# Patient Record
Sex: Male | Born: 1935 | Hispanic: No | Smoking: Current every day smoker
Health system: Southern US, Community
[De-identification: ages and names within clinical notes are randomized; demographics above are authoritative.]

## PROBLEM LIST (undated history)

## (undated) DIAGNOSIS — F0390 Unspecified dementia without behavioral disturbance: Secondary | ICD-10-CM

## (undated) DIAGNOSIS — Z7289 Other problems related to lifestyle: Secondary | ICD-10-CM

## (undated) DIAGNOSIS — F17200 Nicotine dependence, unspecified, uncomplicated: Secondary | ICD-10-CM

## (undated) DIAGNOSIS — H9190 Unspecified hearing loss, unspecified ear: Secondary | ICD-10-CM

## (undated) DIAGNOSIS — Z8619 Personal history of other infectious and parasitic diseases: Secondary | ICD-10-CM

## (undated) DIAGNOSIS — E538 Deficiency of other specified B group vitamins: Secondary | ICD-10-CM

## (undated) DIAGNOSIS — I739 Peripheral vascular disease, unspecified: Secondary | ICD-10-CM

## (undated) DIAGNOSIS — I10 Essential (primary) hypertension: Secondary | ICD-10-CM

## (undated) DIAGNOSIS — F1090 Alcohol use, unspecified, uncomplicated: Secondary | ICD-10-CM

## (undated) DIAGNOSIS — G459 Transient cerebral ischemic attack, unspecified: Secondary | ICD-10-CM

## (undated) DIAGNOSIS — E785 Hyperlipidemia, unspecified: Secondary | ICD-10-CM

## (undated) DIAGNOSIS — J449 Chronic obstructive pulmonary disease, unspecified: Secondary | ICD-10-CM

## (undated) HISTORY — DX: Hyperlipidemia, unspecified: E78.5

## (undated) HISTORY — DX: Essential (primary) hypertension: I10

## (undated) HISTORY — DX: Deficiency of other specified B group vitamins: E53.8

## (undated) HISTORY — DX: Chronic obstructive pulmonary disease, unspecified: J44.9

## (undated) HISTORY — DX: Unspecified dementia, unspecified severity, without behavioral disturbance, psychotic disturbance, mood disturbance, and anxiety: F03.90

## (undated) HISTORY — DX: Personal history of other infectious and parasitic diseases: Z86.19

## (undated) HISTORY — PX: TONSILLECTOMY: SUR1361

## (undated) HISTORY — PX: CATARACT EXTRACTION, BILATERAL: SHX1313

## (undated) HISTORY — DX: Transient cerebral ischemic attack, unspecified: G45.9

## (undated) HISTORY — DX: Unspecified hearing loss, unspecified ear: H91.90

## (undated) HISTORY — DX: Nicotine dependence, unspecified, uncomplicated: F17.200

## (undated) HISTORY — DX: Alcohol use, unspecified, uncomplicated: F10.90

## (undated) HISTORY — DX: Peripheral vascular disease, unspecified: I73.9

## (undated) HISTORY — DX: Other problems related to lifestyle: Z72.89

---

## 2009-02-11 LAB — PULMONARY FUNCTION TEST

## 2009-06-14 DIAGNOSIS — G459 Transient cerebral ischemic attack, unspecified: Secondary | ICD-10-CM

## 2009-06-14 HISTORY — DX: Transient cerebral ischemic attack, unspecified: G45.9

## 2009-06-20 ENCOUNTER — Inpatient Hospital Stay (HOSPITAL_COMMUNITY): Admission: EM | Admit: 2009-06-20 | Discharge: 2009-06-22 | Payer: Self-pay | Admitting: Emergency Medicine

## 2009-06-21 ENCOUNTER — Encounter (INDEPENDENT_AMBULATORY_CARE_PROVIDER_SITE_OTHER): Payer: Self-pay | Admitting: Neurology

## 2010-11-16 LAB — URINALYSIS, ROUTINE W REFLEX MICROSCOPIC
Bilirubin Urine: NEGATIVE
Ketones, ur: NEGATIVE mg/dL
Specific Gravity, Urine: 1.016 (ref 1.005–1.030)
Urobilinogen, UA: 0.2 mg/dL (ref 0.0–1.0)

## 2010-11-16 LAB — CBC
Hemoglobin: 12.6 g/dL — ABNORMAL LOW (ref 13.0–17.0)
MCHC: 34.8 g/dL (ref 30.0–36.0)
RBC: 3.72 MIL/uL — ABNORMAL LOW (ref 4.22–5.81)
RDW: 12.4 % (ref 11.5–15.5)

## 2010-11-16 LAB — CK TOTAL AND CKMB (NOT AT ARMC)
CK, MB: 2.3 ng/mL (ref 0.3–4.0)
Relative Index: INVALID (ref 0.0–2.5)
Total CK: 80 U/L (ref 7–232)

## 2010-11-16 LAB — DIFFERENTIAL
Basophils Relative: 1 % (ref 0–1)
Lymphocytes Relative: 19 % (ref 12–46)
Lymphs Abs: 1.2 10*3/uL (ref 0.7–4.0)
Monocytes Relative: 11 % (ref 3–12)
Neutro Abs: 4.2 10*3/uL (ref 1.7–7.7)
Neutrophils Relative %: 68 % (ref 43–77)

## 2010-11-16 LAB — GLUCOSE, CAPILLARY
Glucose-Capillary: 106 mg/dL — ABNORMAL HIGH (ref 70–99)
Glucose-Capillary: 95 mg/dL (ref 70–99)

## 2010-11-16 LAB — COMPREHENSIVE METABOLIC PANEL
ALT: 13 U/L (ref 0–53)
AST: 16 U/L (ref 0–37)
Albumin: 3.5 g/dL (ref 3.5–5.2)
BUN: 13 mg/dL (ref 6–23)
CO2: 22 mEq/L (ref 19–32)
Calcium: 8.6 mg/dL (ref 8.4–10.5)
Chloride: 99 mEq/L (ref 96–112)
Creatinine, Ser: 1.19 mg/dL (ref 0.4–1.5)
GFR calc Af Amer: >60 mL/min (ref >60)
GFR calc non Af Amer: >60 mL/min (ref >60)
Glucose, Bld: 111 mg/dL — ABNORMAL HIGH (ref 70–99)
Sodium: 137 mEq/L (ref 135–145)
Total Bilirubin: 0.7 mg/dL (ref 0.3–1.2)
Total Protein: 7.1 g/dL (ref 6.0–8.3)

## 2010-11-16 LAB — APTT
aPTT: 33 seconds (ref 24–37)
aPTT: 33 seconds (ref 24–37)

## 2010-11-16 LAB — PROTIME-INR
INR: 0.98 (ref 0.00–1.49)
INR: 1 (ref 0.00–1.49)

## 2010-11-16 LAB — URINE CULTURE: Colony Count: NO GROWTH

## 2010-11-16 LAB — LIPID PANEL
LDL Cholesterol: 107 mg/dL — ABNORMAL HIGH (ref 0–99)
Total CHOL/HDL Ratio: 3 RATIO
Triglycerides: 100 mg/dL (ref <150)
VLDL: 11 mg/dL (ref 0–40)

## 2010-11-16 LAB — CARDIAC PANEL(CRET KIN+CKTOT+MB+TROPI): Troponin I: 0.02 ng/mL (ref 0.00–0.06)

## 2010-11-16 LAB — TROPONIN I: Troponin I: 0.03 ng/mL (ref 0.00–0.06)

## 2010-11-16 LAB — URINE MICROSCOPIC-ADD ON

## 2012-07-25 LAB — COMPREHENSIVE METABOLIC PANEL: Creat: 1.55

## 2012-07-25 LAB — TSH: TSH: 2.27

## 2012-07-25 LAB — LIPID PANEL
Direct LDL: 93
Triglycerides: 102

## 2012-08-03 ENCOUNTER — Emergency Department (HOSPITAL_COMMUNITY)
Admission: EM | Admit: 2012-08-03 | Discharge: 2012-08-04 | Disposition: A | Discharge status: Home / Self Care | Payer: Medicare Other | Attending: Emergency Medicine | Admitting: Emergency Medicine

## 2012-08-03 ENCOUNTER — Encounter (HOSPITAL_COMMUNITY): Payer: Self-pay | Admitting: *Deleted

## 2012-08-03 DIAGNOSIS — Y849 Medical procedure, unspecified as the cause of abnormal reaction of the patient, or of later complication, without mention of misadventure at the time of the procedure: Secondary | ICD-10-CM | POA: Insufficient documentation

## 2012-08-03 DIAGNOSIS — E119 Type 2 diabetes mellitus without complications: Secondary | ICD-10-CM | POA: Insufficient documentation

## 2012-08-03 DIAGNOSIS — F17200 Nicotine dependence, unspecified, uncomplicated: Secondary | ICD-10-CM | POA: Insufficient documentation

## 2012-08-03 DIAGNOSIS — Z7982 Long term (current) use of aspirin: Secondary | ICD-10-CM | POA: Insufficient documentation

## 2012-08-03 DIAGNOSIS — IMO0002 Reserved for concepts with insufficient information to code with codable children: Secondary | ICD-10-CM | POA: Insufficient documentation

## 2012-08-03 DIAGNOSIS — Z79899 Other long term (current) drug therapy: Secondary | ICD-10-CM | POA: Insufficient documentation

## 2012-08-03 LAB — COMPREHENSIVE METABOLIC PANEL
AST: 14 U/L (ref 0–37)
Alkaline Phosphatase: 53 U/L (ref 39–117)
BUN: 22 mg/dL (ref 6–23)
CO2: 24 mEq/L (ref 19–32)
Chloride: 104 mEq/L (ref 96–112)
Creatinine, Ser: 1.27 mg/dL (ref 0.50–1.35)
GFR calc non Af Amer: 54 mL/min — ABNORMAL LOW (ref >90)
Total Bilirubin: 0.2 mg/dL — ABNORMAL LOW (ref 0.3–1.2)

## 2012-08-03 LAB — CBC WITH DIFFERENTIAL/PLATELET
Basophils Absolute: 0 10*3/uL (ref 0.0–0.1)
Basophils Relative: 0 % (ref 0–1)
HCT: 33.4 % — ABNORMAL LOW (ref 39.0–52.0)
Hemoglobin: 11.4 g/dL — ABNORMAL LOW (ref 13.0–17.0)
Lymphocytes Relative: 28 % (ref 12–46)
MCH: 32 pg (ref 26.0–34.0)
Monocytes Absolute: 0.9 10*3/uL (ref 0.1–1.0)
Monocytes Relative: 12 % (ref 3–12)
Neutro Abs: 4.1 10*3/uL (ref 1.7–7.7)
RDW: 12.2 % (ref 11.5–15.5)
WBC: 7.3 10*3/uL (ref 4.0–10.5)

## 2012-08-03 NOTE — ED Notes (Signed)
The pt had dental work on dec 6th and since then he has been bleeding from his mouth almost continuous

## 2012-08-03 NOTE — ED Notes (Signed)
Tea bag placed in the area of his mouth that is bleeding

## 2012-08-04 ENCOUNTER — Emergency Department (HOSPITAL_COMMUNITY): Payer: Medicare Other

## 2012-08-04 LAB — HEMOGLOBIN AND HEMATOCRIT, BLOOD: HCT: 33.9 % — ABNORMAL LOW (ref 39.0–52.0)

## 2012-08-04 MED ORDER — AMINOCAPROIC ACID SOLUTION 5% (50 MG/ML)
5.0000 mL | ORAL | Status: AC
  Administered 2012-08-04: 5 mL via ORAL
  Filled 2012-08-04: qty 100

## 2012-08-04 MED ORDER — AMINOCAPROIC ACID SOLUTION 5% (50 MG/ML): 5.0000 mL | ORAL | Status: DC

## 2012-08-04 NOTE — ED Notes (Signed)
Patient transported to X-ray 

## 2012-08-04 NOTE — ED Notes (Signed)
MD at bedside. 

## 2012-08-04 NOTE — ED Provider Notes (Addendum)
History     CSN: [id number]  Arrival date & time 08/03/12  2046   First MD Initiated Contact with Patient 08/03/12 2322      Chief Complaint  Patient presents with  . Dental Problem    (Consider location/radiation/quality/duration/timing/severity/associated sxs/prior treatment) HPI Comments: Shaun Contreras awoke this morning with oozing blood from a dental extraction performed on 12/6.  Shaun Contreras denies any fever, jaw pain, localized pain, trauma, or bleeding from other sites.  Shaun Contreras reports feeling otherwise well.  Shaun Contreras contacted his dentist but was unable to get a follow-up visit.  The oozing stopped briefly but has returned.  Shaun Contreras has attempted to apply direct pressure and biting on a tea bag without success.  The history is provided by the patient. No language interpreter was used.    Past Medical History  Diagnosis Date  . Diabetes mellitus without complication     History reviewed. No pertinent past surgical history.  No family history on file.  History  Substance Use Topics  . Smoking status: Current Every Day Smoker  . Smokeless tobacco: Not on file  . Alcohol Use: Yes      Review of Systems  All other systems reviewed and are negative.    Allergies  Review of patient's allergies indicates no known allergies.  Home Medications   Current Outpatient Rx  Name  Route  Sig  Dispense  Refill  . ASPIRIN EC 325 MG PO TBEC   Oral   Take 325 mg by mouth daily.         Marland Kitchen VITAMIN D3 2000 UNITS PO TABS   Oral   Take 1 tablet by mouth daily.         . DONEPEZIL HCL 10 MG PO TABS   Oral   Take 10 mg by mouth 2 (two) times daily.         Marland Kitchen LISINOPRIL 10 MG PO TABS   Oral   Take 10 mg by mouth daily.         Marland Kitchen PRAVASTATIN SODIUM 40 MG PO TABS   Oral   Take 40 mg by mouth daily.           BP 124/50  Pulse 62  Temp 98.3 F (36.8 C) (Oral)  Resp 20  SpO2 98%  Physical Exam  Nursing note and vitals reviewed. Constitutional: Shaun Contreras is oriented to person, place,  and time. Shaun Contreras appears well-developed and well-nourished. Shaun Contreras is active and cooperative.  Non-toxic appearance. Shaun Contreras does not have a sickly appearance. Shaun Contreras does not appear ill. No distress.  HENT:  Head: Normocephalic and atraumatic. Head is without raccoon's eyes, without Battle's sign, without contusion, without laceration, without right periorbital erythema and without left periorbital erythema.  Nose: Nose normal. No rhinorrhea. No epistaxis.  Mouth/Throat: Uvula is midline, oropharynx is clear and moist and mucous membranes are normal. Mucous membranes are not pale, not dry and not cyanotic. Shaun Contreras has dentures (upper plate.  only 2 lower teeth remain.  bleeding is from socket adj to lower left canine). Abnormal dentition. No dental abscesses. No oropharyngeal exudate, posterior oropharyngeal edema, posterior oropharyngeal erythema or tonsillar abscesses.  Neck: Normal range of motion. Neck supple. No JVD present. No tracheal deviation present.  Pulmonary/Chest: No stridor.  Abdominal: Soft. Bowel sounds are normal. Shaun Contreras exhibits no distension. There is no tenderness. There is no rebound and no guarding.  Lymphadenopathy:    Shaun Contreras has no cervical adenopathy.  Neurological: Shaun Contreras is alert and oriented to person, place, and  time. No cranial nerve deficit. Coordination (nl gait) normal.  Skin: Skin is warm and dry. No rash noted. Shaun Contreras is not diaphoretic. No erythema. No pallor.  Psychiatric: Shaun Contreras has a normal mood and affect.    ED Course  Procedures (including critical care time)  Labs Reviewed  CBC WITH DIFFERENTIAL - Abnormal; Notable for the following:    RBC 3.56 (*)     Hemoglobin 11.4 (*)     HCT 33.4 (*)     All other components within normal limits  COMPREHENSIVE METABOLIC PANEL - Abnormal; Notable for the following:    Albumin 3.4 (*)     Total Bilirubin 0.2 (*)     GFR calc non Af Amer 54 (*)     GFR calc Af Amer 62 (*)     All other components within normal limits  PROTIME-INR   No results  found.   No diagnosis found.    MDM  Pt presents with bleeding from the socket after a dental extraction on 12/6.  Shaun Contreras is afebrile, has no trismus, note stable VS, NAD.  Shaun Contreras does take a daily aspirin but has no history of coagulopathy or liver disorder.  Note mild anemia.  Will attempt to evacuate any remaining clot in the tooth socket and try applying direct pressure to stop the oozing.  0800.  Pt stable, NAD.  Shaun Contreras has continued oozing from the tooth socket.  Attempted removing the existing clot an then holding a tea bag firmly over the site.  Next employed direct pressure at the site with surgicel.  There was transient resolution of the oozing and then it returned again.  Lastly, evacuated the the poorly formed clot from the socket and rinsed the mouth with amicar.  The oozing has slowed but continues.  I discussed his evaluation at length with Dr. Jeanice Lim (oral surgery).  Shaun Contreras will evaluate him at the bedside. 1015.  Pt stable, NAD.  Repet H&H is unchanged.  Dr. Jeanice Lim performed a bedside procedure.  Plan discharge home.     Tobin Chad, MD 08/04/12 4098  Tobin Chad, MD 08/04/12 1025

## 2012-08-04 NOTE — ED Notes (Signed)
Oral surgeon at beside.

## 2012-08-04 NOTE — ED Notes (Signed)
Daughter stated, I'm concerned about my dad not eating in 36 hours.  Discussed with Dr. Lorenso Courier.

## 2012-08-04 NOTE — ED Notes (Signed)
Report to Greg RN.

## 2012-08-04 NOTE — ED Notes (Signed)
Bedside table ready for MD. MD notified.

## 2012-08-04 NOTE — Consult Note (Signed)
Oral & Maxillofacial Surgery - Consult Reason for Consult:Oral Bleeding Referring Physician: Dr. Truman Hayward [name] is an 77 y.o. male.  HPI: Shaun Contreras had several teeth removed around the first week of December (07/17/12) and did well s/p surgery.  Yesterday morning he started bleeding from one of the extraction sites on the lower left and was unable to stop the bleeding.  In the ER, direct pressure was used and Amicar was used; however, nothing slowed the bleeding.  The patient does not report a history of Hemophilia or Von Willebrands; however, he does drink 4 beers daily since age 21.  PMHx:  Past Medical History  Diagnosis Date  . Diabetes mellitus without complication     PSx: History reviewed. No pertinent past surgical history.  Family Hx: No family history on file.  Social Hx:  reports that he has been smoking.  He does not have any smokeless tobacco history on file. He reports that he drinks alcohol. His drug history not on file.  Allergies: No Known Allergies  Medications: I have reviewed the patient's current medications.  Labs:  Results for orders placed during the hospital encounter of 08/03/12 (from the past 48 hour(s))  CBC WITH DIFFERENTIAL     Status: Abnormal   Collection Time   08/03/12  9:01 PM      Component Value Range Comment   WBC 7.3  4.0 - 10.5 K/uL    RBC 3.56 (*) 4.22 - 5.81 MIL/uL    Hemoglobin 11.4 (*) 13.0 - 17.0 g/dL    HCT 14.7 (*) 82.9 - 52.0 %    MCV 93.8  78.0 - 100.0 fL    MCH 32.0  26.0 - 34.0 pg    MCHC 34.1  30.0 - 36.0 g/dL    RDW 56.2  13.0 - 86.5 %    Platelets 236  150 - 400 K/uL    Neutrophils Relative 57  43 - 77 %    Neutro Abs 4.1  1.7 - 7.7 K/uL    Lymphocytes Relative 28  12 - 46 %    Lymphs Abs 2.0  0.7 - 4.0 K/uL    Monocytes Relative 12  3 - 12 %    Monocytes Absolute 0.9  0.1 - 1.0 K/uL    Eosinophils Relative 3  0 - 5 %    Eosinophils Absolute 0.2  0.0 - 0.7 K/uL    Basophils Relative 0  0 - 1 %    Basophils Absolute  0.0  0.0 - 0.1 K/uL   COMPREHENSIVE METABOLIC PANEL     Status: Abnormal   Collection Time   08/03/12  9:01 PM      Component Value Range Comment   Sodium 138  135 - 145 mEq/Contreras    Potassium 4.4  3.5 - 5.1 mEq/Contreras    Chloride 104  96 - 112 mEq/Contreras    CO2 24  19 - 32 mEq/Contreras    Glucose, Bld 96  70 - 99 mg/dL    BUN 22  6 - 23 mg/dL    Creatinine, Ser 7.84  0.50 - 1.35 mg/dL    Calcium 8.9  8.4 - 69.6 mg/dL    Total Protein 6.6  6.0 - 8.3 g/dL    Albumin 3.4 (*) 3.5 - 5.2 g/dL    AST 14  0 - 37 U/Contreras    ALT 6  0 - 53 U/Contreras    Alkaline Phosphatase 53  39 - 117 U/Contreras    Total  Bilirubin 0.2 (*) 0.3 - 1.2 mg/dL    GFR calc non Af Amer 54 (*) >90 mL/min    GFR calc Af Amer 62 (*) >90 mL/min     Radiology: Dg Orthopantogram  08/04/2012  *RADIOLOGY REPORT*  Clinical Data: Bleeding after dental extraction adjacent to the lower canine  ORTHOPANTOGRAM/PANORAMIC  Comparison: None.  Findings: Poor dentition.  No definite evidence for retained tooth fragment or foreign body.  No fracture.  The remaining teeth are poorly evaluated due to motion artifact.  IMPRESSION: No retained tooth fragment or foreign body appreciated at the site of extraction.   Original Report Authenticated By: Jearld Lesch, M.D.     NWG:[id number] items are noted in HPI.  Vital Signs: BP 114/43  Pulse 58  Temp 98.3 F (36.8 C) (Oral)  Resp 20  SpO2 100%  Physical Exam: General appearance: alert and cooperative Head: Normocephalic, without obvious abnormality, atraumatic Throat: abnormal findings: Bleeding form extraction site #21, the patient has an edentulous maxilla, and is partially edentulous in the mandible.  Assessment/Plan: [name] has a liver clot associated with extraction site #21. 1. 3.6 mL of 2% lidocaine with 1:100,000 epi 2. Debrided the extraction site  #21 3. Placed surgicel soaked with Amicar and 3.0 chromic gut suture 4. Hemostasis obtained 5. Follow up in my office tomorrow at 3:00pm -  [phone].  Shaun Contreras,Shaun Contreras  08/04/2012, 9:22 AM

## 2012-08-04 NOTE — ED Notes (Signed)
Report given by Kristi, RN 

## 2012-08-14 HISTORY — PX: DENTAL SURGERY: SHX609

## 2012-08-14 HISTORY — PX: OTHER SURGICAL HISTORY: SHX169

## 2012-09-12 ENCOUNTER — Ambulatory Visit: Payer: Medicare Other | Admitting: Family Medicine

## 2012-11-05 ENCOUNTER — Ambulatory Visit (INDEPENDENT_AMBULATORY_CARE_PROVIDER_SITE_OTHER): Payer: Medicare Other | Admitting: Family Medicine

## 2012-11-05 ENCOUNTER — Encounter: Payer: Self-pay | Admitting: Family Medicine

## 2012-11-05 VITALS — BP 124/62 | HR 72 | Temp 97.7°F | Ht 65.5 in | Wt 120.0 lb

## 2012-11-05 DIAGNOSIS — H9190 Unspecified hearing loss, unspecified ear: Secondary | ICD-10-CM

## 2012-11-05 DIAGNOSIS — H9193 Unspecified hearing loss, bilateral: Secondary | ICD-10-CM

## 2012-11-05 DIAGNOSIS — F1090 Alcohol use, unspecified, uncomplicated: Secondary | ICD-10-CM

## 2012-11-05 DIAGNOSIS — I6350 Cerebral infarction due to unspecified occlusion or stenosis of unspecified cerebral artery: Secondary | ICD-10-CM

## 2012-11-05 DIAGNOSIS — F1010 Alcohol abuse, uncomplicated: Secondary | ICD-10-CM

## 2012-11-05 DIAGNOSIS — I639 Cerebral infarction, unspecified: Secondary | ICD-10-CM

## 2012-11-05 DIAGNOSIS — F17200 Nicotine dependence, unspecified, uncomplicated: Secondary | ICD-10-CM

## 2012-11-05 DIAGNOSIS — E785 Hyperlipidemia, unspecified: Secondary | ICD-10-CM

## 2012-11-05 DIAGNOSIS — I10 Essential (primary) hypertension: Secondary | ICD-10-CM

## 2012-11-05 DIAGNOSIS — F0390 Unspecified dementia without behavioral disturbance: Secondary | ICD-10-CM | POA: Insufficient documentation

## 2012-11-05 DIAGNOSIS — Z7289 Other problems related to lifestyle: Secondary | ICD-10-CM | POA: Insufficient documentation

## 2012-11-05 NOTE — Patient Instructions (Signed)
Good to see you today, call us with questions. Return in 2-3 months for medicare wellness visit, prior fasting for blood work. Keep working on cutting back on smoking.

## 2012-11-05 NOTE — Assessment & Plan Note (Signed)
Chronic, stable. Continue pravastatin, await latest blood work.

## 2012-11-05 NOTE — Assessment & Plan Note (Signed)
Chronic, stable. Continue lisinopril 10mg daily. 

## 2012-11-05 NOTE — Assessment & Plan Note (Signed)
Await records from Dr. Lacie Scotts and will review records in our chart from [facility]. Continue aspirin 325mg  for now.  Consider decreasing aspirin dose

## 2012-11-05 NOTE — Assessment & Plan Note (Signed)
Mild - await records. Continue aricept 10mg 

## 2012-11-05 NOTE — Assessment & Plan Note (Signed)
Pending hearing aid fitting bilaterally.

## 2012-11-05 NOTE — Assessment & Plan Note (Signed)
Discussed smoking cessation  Discussed lung cancer screening with low res CT scan - pt opts to defer for now, does not want screening unless necessary.

## 2012-11-05 NOTE — Assessment & Plan Note (Signed)
Discussed risks of alcohol abuse. Per pt no problem, but per daughter more of an issue than he is letting on. Discussed alcohol use in moderation

## 2012-11-05 NOTE — Progress Notes (Signed)
Subjective:    Patient ID: Shaun Contreras, male    DOB: Nov 03, 1934, 78 y.o.   MRN: [id number]  HPI CC: new pt to establish  Prior saw Dr. Lacie Scotts.  Presents with daughter, Vernona Rieger today.  Memory troubles - has been on aricept for last 2 years.  Strong fmhx dementia and alzheimer's in family.  Lives alone, daughter nearby.  Recent dental surgery.  Had liver clot.  HTN - on lisinopril and tolerating well. HLD - pravastatin, no myalgias. H/o multiple mini strokes - on ASA 325mg  since then.  Smoker - 1.5 ppd.  Working on cutting back. EtOH - 3 beers/day.  Per daughter 6 beers/day.  Denies hangovers or trouble with law.  Last blood work was December 2013.  No h/o alcohol withdrawal.  Can go days without drinking. Hard of hearing - has seen [facility].  Pending sizing for hearing aides.  Preventative: Within last year. Tetanus - unsure, maybe around 2009.  Medications and allergies reviewed and updated in chart.  Past histories reviewed and updated if relevant as below. There is no problem list on file for this patient.  Past Medical History  Diagnosis Date  . Alcoholism   . CVA (cerebral infarction) 2010    multiple TIAs  . Hypertension   . Hyperlipidemia   . Dementia   . History of chicken pox    Past Surgical History  Procedure Laterality Date  . Tonsillectomy      as teen  . Dental surgery  2014   History  Substance Use Topics  . Smoking status: Current Every Day Smoker -- 1.50 packs/day    Types: Cigarettes  . Smokeless tobacco: Never Used  . Alcohol Use: Yes     Comment: 6 beers/daily   Family History  Problem Relation Age of Onset  . Cancer Brother     lung  . Cancer Brother     prostate  . Cancer Brother     stomach  . Stroke Brother   . Diabetes Mother   . Diabetes Brother   . CAD Neg Hx    No Known Allergies Current Outpatient Prescriptions on File Prior to Visit  Medication Sig Dispense Refill  . aspirin EC 325 MG tablet Take 325 mg by mouth  daily.      . Cholecalciferol (VITAMIN D3) 2000 UNITS TABS Take 1 tablet by mouth daily.      Marland Kitchen donepezil (ARICEPT) 10 MG tablet Take 10 mg by mouth daily.       Marland Kitchen lisinopril (PRINIVIL,ZESTRIL) 10 MG tablet Take 10 mg by mouth daily.      . pravastatin (PRAVACHOL) 40 MG tablet Take 40 mg by mouth daily.       No current facility-administered medications on file prior to visit.     Review of Systems  Constitutional: Negative for fever, chills, activity change, appetite change, fatigue and unexpected weight change.  HENT: Negative for hearing loss and neck pain.   Eyes: Negative for visual disturbance.  Respiratory: Negative for cough, chest tightness, shortness of breath and wheezing.   Cardiovascular: Negative for chest pain, palpitations and leg swelling.  Gastrointestinal: Negative for nausea, vomiting, abdominal pain, diarrhea, constipation, blood in stool and abdominal distention.  Genitourinary: Negative for hematuria and difficulty urinating.  Musculoskeletal: Negative for myalgias and arthralgias.  Skin: Negative for rash.  Neurological: Negative for dizziness, seizures, syncope and headaches.  Hematological: Bruises/bleeds easily.  Psychiatric/Behavioral: Negative for dysphoric mood. The patient is not nervous/anxious.  Objective:   Physical Exam  Nursing note and vitals reviewed. Constitutional: He is oriented to person, place, and time. He appears well-developed and well-nourished. No distress.  thin  HENT:  Head: Normocephalic and atraumatic.  Right Ear: Tympanic membrane, external ear and ear canal normal. Decreased hearing is noted.  Left Ear: Tympanic membrane, external ear and ear canal normal. Decreased hearing is noted.  Nose: Nose normal.  Mouth/Throat: Oropharynx is clear and moist. No oropharyngeal exudate.  L cerumen in canal  Eyes: Conjunctivae and EOM are normal. Pupils are equal, round, and reactive to light. No scleral icterus.  Neck: Normal range  of motion. Neck supple. Carotid bruit is not present. No thyromegaly present.  Cardiovascular: Normal rate, regular rhythm, normal heart sounds and intact distal pulses.   No murmur heard. Pulses:      Radial pulses are 2+ on the right side, and 2+ on the left side.  Pulmonary/Chest: Effort normal and breath sounds normal. No respiratory distress. He has no wheezes. He has no rales.  Abdominal: Soft. Bowel sounds are normal. He exhibits no distension and no mass. There is no tenderness. There is no rebound and no guarding.  Musculoskeletal: Normal range of motion. He exhibits no edema.  Lymphadenopathy:    He has no cervical adenopathy.  Neurological: He is alert and oriented to person, place, and time.  CN grossly intact, station and gait intact  Skin: Skin is warm and dry. No rash noted.  Psychiatric: He has a normal mood and affect. His behavior is normal. Judgment and thought content normal.       Assessment & Plan:

## 2012-12-01 ENCOUNTER — Encounter: Payer: Self-pay | Admitting: Family Medicine

## 2012-12-01 DIAGNOSIS — J449 Chronic obstructive pulmonary disease, unspecified: Secondary | ICD-10-CM | POA: Insufficient documentation

## 2012-12-04 ENCOUNTER — Ambulatory Visit (INDEPENDENT_AMBULATORY_CARE_PROVIDER_SITE_OTHER): Payer: Medicare Other | Admitting: Family Medicine

## 2012-12-04 ENCOUNTER — Encounter: Payer: Self-pay | Admitting: Family Medicine

## 2012-12-04 VITALS — BP 118/68 | HR 80 | Temp 98.0°F | Ht 65.5 in | Wt 121.5 lb

## 2012-12-04 DIAGNOSIS — I10 Essential (primary) hypertension: Secondary | ICD-10-CM

## 2012-12-04 DIAGNOSIS — Z125 Encounter for screening for malignant neoplasm of prostate: Secondary | ICD-10-CM

## 2012-12-04 DIAGNOSIS — E785 Hyperlipidemia, unspecified: Secondary | ICD-10-CM

## 2012-12-04 DIAGNOSIS — Z23 Encounter for immunization: Secondary | ICD-10-CM

## 2012-12-04 DIAGNOSIS — Z7289 Other problems related to lifestyle: Secondary | ICD-10-CM

## 2012-12-04 DIAGNOSIS — Z0000 Encounter for general adult medical examination without abnormal findings: Secondary | ICD-10-CM | POA: Insufficient documentation

## 2012-12-04 DIAGNOSIS — Z1211 Encounter for screening for malignant neoplasm of colon: Secondary | ICD-10-CM

## 2012-12-04 DIAGNOSIS — F17200 Nicotine dependence, unspecified, uncomplicated: Secondary | ICD-10-CM

## 2012-12-04 LAB — LIPID PANEL
Cholesterol: 173 mg/dL (ref 0–200)
LDL Cholesterol: 105 mg/dL — ABNORMAL HIGH (ref 0–99)
Total CHOL/HDL Ratio: 3

## 2012-12-04 LAB — HEMOCCULT GUIAC POC 1CARD (OFFICE): Fecal Occult Blood, POC: NEGATIVE

## 2012-12-04 LAB — BASIC METABOLIC PANEL
BUN: 19 mg/dL (ref 6–23)
CO2: 26 mEq/L (ref 19–32)
Calcium: 9 mg/dL (ref 8.4–10.5)
Creatinine, Ser: 1.4 mg/dL (ref 0.4–1.5)
Glucose, Bld: 89 mg/dL (ref 70–99)

## 2012-12-04 NOTE — Addendum Note (Signed)
Addended by: Josph Macho A on: 12/04/2012 09:52 AM   Modules accepted: Orders

## 2012-12-04 NOTE — Assessment & Plan Note (Signed)
Pt endorses cutting back.  Down to 3 beers max/day

## 2012-12-04 NOTE — Patient Instructions (Signed)
Pneumonia shot today. Stool kit today Bring me copy of living will for chart. Good to see you, call us with questions. Blood work today. Return in 6 months for checkup.  Sooner if needed.

## 2012-12-04 NOTE — Assessment & Plan Note (Signed)
I have personally reviewed the Medicare Annual Wellness questionnaire and have noted 1. The patient's medical and social history 2. Their use of alcohol, tobacco or illicit drugs 3. Their current medications and supplements 4. The patient's functional ability including ADL's, fall risks, home safety risks and hearing or visual impairment. 5. Diet and physical activity 6. Evidence for depression or mood disorders The patients weight, height, BMI have been recorded in the chart.  Hearing and vision has been addressed. I have made referrals, counseling and provided education to the patient based review of the above and I have provided the pt with a written personalized care plan for preventive services. See scanned questionairre. Advanced directives discussed: daughter is HCPOA.  Will bring me living will they have at home.  Reviewed preventative protocols and updated unless pt declined.  Pneumovax today. iFOB today. PSA/DRE today given fmhx.

## 2012-12-04 NOTE — Progress Notes (Signed)
Subjective:    Patient ID: Shaun Contreras, male    DOB: October 10, 1934, 78 y.o.   MRN: [id number]  HPI CC: medicare wellness visit  Presents with daughter.  Has been told has PVD, no recent ABIs.  Occasional cramping at rest.  No claduication sxs.  ?h/o BPH.  Smoker - <1 ppd. Working on cutting back.  EtOH - 3 beers/day max per patient. Per daughter 6 beers/day. Denies hangovers or trouble with law. Last blood work was December 2013. No h/o alcohol withdrawal. Can go days without drinking. Memory - taking aricept 10mg  daily.  Mild dementia.  Preventative:  Colon cancer screening - no blood in stool.  Stool kit today. Prostate cancer screening - states h/o enlarged prostate.  Brother with h/o prostate cancer. Flu shot - doesn't get Tetanus - unsure, maybe around 2009. Pneumovax - to receive today. Shingles - states has received Advanced directives: has living will at home.  Daughter is HCPOA.    Widower.  Wife died from lung cancer Lives alone Occupation: retired, used to work in [employer]: HS Activity: no regular exercise, occasional walking Diet: some water, fruits some, occasional vegetables  Vision screen passed. Hearing screen failed - has had audiology evaluation, pending finances to buy hearing aides.  Has seen [facility]. Recent dental work.  1 fall in last year.  Mechanical fall when trying to get wet towel over bathroom bed. Denies depression/anhedonia, sadness.  Medications and allergies reviewed and updated in chart.  Past histories reviewed and updated if relevant as below. Patient Active Problem List  Diagnosis  . Habitual alcohol use  . CVA (cerebral infarction)  . Hypertension  . Hyperlipidemia  . Dementia  . Hearing loss  . Smoker  . COPD (chronic obstructive pulmonary disease)  . Medicare annual wellness visit, initial   Past Medical History  Diagnosis Date  . Habitual alcohol use   . TIA (transient ischemic attack) 06/2009    transient RUE  weakness, dysarthria (thought L hemispheric TIA)  . Hypertension   . Hyperlipidemia   . Dementia     mild organic brain syndrome  . History of chicken pox   . Hearing loss     pending hearing aides  . Smoker   . COPD (chronic obstructive pulmonary disease)     from smoking history however spirometry 02/2009: FVC 85%, FEV1 100%, ratio 0.95, WNL   Past Surgical History  Procedure Laterality Date  . Tonsillectomy      as teen  . Dental surgery  2014  . Cataract extraction, bilateral Bilateral    History  Substance Use Topics  . Smoking status: Current Every Day Smoker -- 1.00 packs/day    Types: Cigarettes  . Smokeless tobacco: Never Used  . Alcohol Use: Yes     Comment: 3 beers/daily   Family History  Problem Relation Age of Onset  . Cancer Brother     lung  . Cancer Brother     prostate  . Cancer Brother     stomach  . Stroke Brother   . Diabetes Mother   . Diabetes Brother   . Alzheimer's disease Brother   . Dementia Father   . CAD Neg Hx    No Known Allergies Current Outpatient Prescriptions on File Prior to Visit  Medication Sig Dispense Refill  . aspirin EC 325 MG tablet Take 325 mg by mouth daily.      . Cholecalciferol (VITAMIN D3) 2000 UNITS TABS Take 1 tablet by mouth daily.      Marland Kitchen  donepezil (ARICEPT) 10 MG tablet Take 10 mg by mouth daily.       Marland Kitchen lisinopril (PRINIVIL,ZESTRIL) 10 MG tablet Take 10 mg by mouth daily.      . pravastatin (PRAVACHOL) 40 MG tablet Take 40 mg by mouth daily.       No current facility-administered medications on file prior to visit.     Review of Systems  Constitutional: Negative for fever, chills, activity change, appetite change, fatigue and unexpected weight change.  HENT: Negative for hearing loss and neck pain.   Eyes: Negative for visual disturbance.  Respiratory: Negative for cough, chest tightness, shortness of breath and wheezing.   Cardiovascular: Negative for chest pain, palpitations and leg swelling.   Gastrointestinal: Negative for nausea, vomiting, abdominal pain, diarrhea, constipation, blood in stool and abdominal distention.  Genitourinary: Negative for hematuria and difficulty urinating.  Musculoskeletal: Negative for myalgias and arthralgias.  Skin: Negative for rash.  Neurological: Negative for dizziness, seizures, syncope and headaches.  Hematological: Bruises/bleeds easily.  Psychiatric/Behavioral: Negative for dysphoric mood. The patient is not nervous/anxious.        Objective:   Physical Exam  Nursing note and vitals reviewed. Constitutional: He is oriented to person, place, and time. He appears well-developed and well-nourished. No distress.  HENT:  Head: Normocephalic and atraumatic.  Right Ear: Hearing, tympanic membrane, external ear and ear canal normal.  Left Ear: Hearing, tympanic membrane, external ear and ear canal normal.  Nose: Nose normal.  Mouth/Throat: Oropharynx is clear and moist. No oropharyngeal exudate.  Eyes: Conjunctivae and EOM are normal. Pupils are equal, round, and reactive to light. No scleral icterus.  Neck: Normal range of motion. Neck supple. No thyromegaly present.  Cardiovascular: Normal rate, regular rhythm, normal heart sounds and intact distal pulses.   No murmur heard. Pulses:      Radial pulses are 2+ on the right side, and 2+ on the left side.  Pulmonary/Chest: Effort normal and breath sounds normal. No respiratory distress. He has no wheezes. He has no rales.  Abdominal: Soft. Bowel sounds are normal. He exhibits no distension and no mass. There is no tenderness. There is no rebound and no guarding.  Genitourinary: Rectum normal. Rectal exam shows no external hemorrhoid, no internal hemorrhoid, no fissure, no mass, no tenderness and anal tone normal. Guaiac negative stool. Prostate is enlarged (20-25gm). Prostate is not tender.  Musculoskeletal: Normal range of motion. He exhibits no edema.  Lymphadenopathy:    He has no cervical  adenopathy.  Neurological: He is alert and oriented to person, place, and time.  CN grossly intact, station and gait intact  Skin: Skin is warm and dry. No rash noted.  Psychiatric: He has a normal mood and affect. His behavior is normal. Judgment and thought content normal.      Assessment & Plan:

## 2012-12-04 NOTE — Assessment & Plan Note (Signed)
Continue to encourage cessation. Pt has cut down to <1ppd.

## 2012-12-05 ENCOUNTER — Encounter: Payer: Self-pay | Admitting: *Deleted

## 2012-12-06 ENCOUNTER — Encounter: Payer: Self-pay | Admitting: Family Medicine

## 2013-01-12 DIAGNOSIS — I739 Peripheral vascular disease, unspecified: Secondary | ICD-10-CM

## 2013-01-12 HISTORY — DX: Peripheral vascular disease, unspecified: I73.9

## 2013-01-14 ENCOUNTER — Ambulatory Visit (INDEPENDENT_AMBULATORY_CARE_PROVIDER_SITE_OTHER): Payer: Medicare Other | Admitting: Family Medicine

## 2013-01-14 ENCOUNTER — Telehealth: Payer: Self-pay

## 2013-01-14 ENCOUNTER — Encounter: Payer: Self-pay | Admitting: Family Medicine

## 2013-01-14 VITALS — BP 118/60 | HR 68 | Temp 97.6°F | Wt 120.2 lb

## 2013-01-14 DIAGNOSIS — F17200 Nicotine dependence, unspecified, uncomplicated: Secondary | ICD-10-CM

## 2013-01-14 DIAGNOSIS — B351 Tinea unguium: Secondary | ICD-10-CM

## 2013-01-14 DIAGNOSIS — L989 Disorder of the skin and subcutaneous tissue, unspecified: Secondary | ICD-10-CM

## 2013-01-14 NOTE — Assessment & Plan Note (Signed)
Encourage cessation. °

## 2013-01-14 NOTE — Progress Notes (Signed)
Subjective:    Patient ID: Shaun Contreras, male    DOB: [birth year], 78 y.o.   MRN: [id number]  HPI CC: check toenails and sore on ankle  3-4 wk h/o sore on inner right ankle.  No bleeding or draining.  Not fully healing.  Not tender or itchy anymore. Treating with bandaid and vaseline.  Issue with toenails longstanding.  No pain.     Medications and allergies reviewed and updated in chart.  Past histories reviewed and updated if relevant as below. Patient Active Problem List   Diagnosis Date Noted  . Skin sore 01/14/2013  . Onychomycosis of toenail 01/14/2013  . Medicare annual wellness visit, initial 12/04/2012  . COPD (chronic obstructive pulmonary disease)   . Habitual alcohol use   . CVA (cerebral infarction)   . Hypertension   . Hyperlipidemia   . Dementia   . Hearing loss   . Smoker    Past Medical History  Diagnosis Date  . Habitual alcohol use   . TIA (transient ischemic attack) 06/2009    transient RUE weakness, dysarthria (thought L hemispheric TIA)  . Hypertension   . Hyperlipidemia   . Dementia     mild organic brain syndrome  . History of chicken pox   . Hearing loss     pending hearing aides  . Smoker   . COPD (chronic obstructive pulmonary disease)     from smoking history however spirometry 02/2009: FVC 85%, FEV1 100%, ratio 0.95, WNL   Past Surgical History  Procedure Laterality Date  . Tonsillectomy      as teen  . Dental surgery  2014  . Cataract extraction, bilateral Bilateral    History  Substance Use Topics  . Smoking status: Current Every Day Smoker -- 1.00 packs/day for 50 years    Types: Cigarettes  . Smokeless tobacco: Never Used  . Alcohol Use: Yes     Comment: 3 beers/daily   Family History  Problem Relation Age of Onset  . Cancer Brother     lung  . Cancer Brother     prostate  . Cancer Brother     stomach  . Stroke Brother   . Diabetes Mother   . Diabetes Brother   . Alzheimer's disease Brother   . Dementia Father   .  CAD Neg Hx    No Known Allergies Current Outpatient Prescriptions on File Prior to Visit  Medication Sig Dispense Refill  . aspirin EC 325 MG tablet Take 325 mg by mouth daily.      . Cholecalciferol (VITAMIN D3) 2000 UNITS TABS Take 1 tablet by mouth daily.      Marland Kitchen donepezil (ARICEPT) 10 MG tablet Take 10 mg by mouth daily.       Marland Kitchen lisinopril (PRINIVIL,ZESTRIL) 10 MG tablet Take 10 mg by mouth daily.      . pravastatin (PRAVACHOL) 40 MG tablet Take 40 mg by mouth daily.       No current facility-administered medications on file prior to visit.     Review of Systems Per HPI    Objective:   Physical Exam  Nursing note and vitals reviewed. Constitutional: He appears well-developed and well-nourished. No distress.  Musculoskeletal: He exhibits no edema.  Reticulated veins bilateral inner ankles diminished DP/PT bilaterally Thickened, elongated, onychomycotic toenails of great toes bilaterally. Other toenails intact. No evidence of ingrowth or paronychia  Skin: Skin is warm and dry.  Erythematous patch on right medial ankle, no open skin.  Mild hyperkeratosis of  lesion       Assessment & Plan:

## 2013-01-14 NOTE — Assessment & Plan Note (Signed)
Refer to podiatry for toenail cutting and eval of onychomycosis.

## 2013-01-14 NOTE — Telephone Encounter (Signed)
Message copied by Marcelle Overlie on Tue Jan 14, 2013 10:05 AM ------      Message from: Coralee Rud      Created: Tue Jan 14, 2013  9:59 AM      Regarding: arterial lower ext       01/22/13 at 11am ------

## 2013-01-14 NOTE — Patient Instructions (Signed)
Pass by Marion's office to schedule podiatry and ABIs. Continue to use vaseline.  Let me know if doesn't continue to heal as expected. Good to see you today, call us with questions.

## 2013-01-14 NOTE — Assessment & Plan Note (Signed)
Doubt venous insufficiency.  Sore actually healing well.  Recommended vaseline and protection.  No evidence of infection. Given decreased pedal pulses and h/o PVD - check ABI to eval LE arterial insuff.

## 2013-01-14 NOTE — Telephone Encounter (Signed)
tcm

## 2013-01-17 ENCOUNTER — Other Ambulatory Visit: Payer: Self-pay | Admitting: *Deleted

## 2013-01-17 MED ORDER — PRAVASTATIN SODIUM 40 MG PO TABS: 40.0000 mg | ORAL_TABLET | Freq: Every day | ORAL | Status: DC

## 2013-01-19 ENCOUNTER — Encounter: Payer: Self-pay | Admitting: Family Medicine

## 2013-01-22 ENCOUNTER — Encounter (INDEPENDENT_AMBULATORY_CARE_PROVIDER_SITE_OTHER): Payer: Medicare Other

## 2013-01-22 DIAGNOSIS — L97909 Non-pressure chronic ulcer of unspecified part of unspecified lower leg with unspecified severity: Secondary | ICD-10-CM

## 2013-01-22 DIAGNOSIS — I739 Peripheral vascular disease, unspecified: Secondary | ICD-10-CM

## 2013-01-22 DIAGNOSIS — L989 Disorder of the skin and subcutaneous tissue, unspecified: Secondary | ICD-10-CM

## 2013-01-23 ENCOUNTER — Encounter: Payer: Self-pay | Admitting: Family Medicine

## 2013-02-26 ENCOUNTER — Other Ambulatory Visit: Payer: Self-pay | Admitting: Family Medicine

## 2013-04-29 ENCOUNTER — Other Ambulatory Visit: Payer: Self-pay | Admitting: Family Medicine

## 2013-06-30 ENCOUNTER — Encounter: Payer: Self-pay | Admitting: Internal Medicine

## 2013-06-30 ENCOUNTER — Ambulatory Visit (INDEPENDENT_AMBULATORY_CARE_PROVIDER_SITE_OTHER): Payer: Medicare Other | Admitting: Internal Medicine

## 2013-06-30 VITALS — BP 120/70 | HR 70 | Temp 98.2°F | Ht 65.5 in | Wt 123.0 lb

## 2013-06-30 DIAGNOSIS — B9789 Other viral agents as the cause of diseases classified elsewhere: Secondary | ICD-10-CM

## 2013-06-30 DIAGNOSIS — J029 Acute pharyngitis, unspecified: Secondary | ICD-10-CM

## 2013-06-30 DIAGNOSIS — B349 Viral infection, unspecified: Secondary | ICD-10-CM

## 2013-06-30 NOTE — Progress Notes (Signed)
Pre-visit discussion using our clinic review tool. No additional management support is needed unless otherwise documented below in the visit note.  

## 2013-06-30 NOTE — Patient Instructions (Signed)
Viral Pharyngitis Viral pharyngitis is a viral infection that produces redness, pain, and swelling (inflammation) of the throat. It can spread from person to person (contagious). CAUSES Viral pharyngitis is caused by inhaling a large amount of certain germs called viruses. Many different viruses cause viral pharyngitis. SYMPTOMS Symptoms of viral pharyngitis include:  Sore throat.  Tiredness.  Stuffy nose.  Low-grade fever.  Congestion.  Cough. TREATMENT Treatment includes rest, drinking plenty of fluids, and the use of over-the-counter medication (approved by your caregiver). HOME CARE INSTRUCTIONS   Drink enough fluids to keep your urine clear or pale yellow.  Eat soft, cold foods such as ice cream, frozen ice pops, or gelatin dessert.  Gargle with warm salt water (1 tsp salt per 1 qt of water).  If over age 7, throat lozenges may be used safely.  Only take over-the-counter or prescription medicines for pain, discomfort, or fever as directed by your caregiver. Do not take aspirin. To help prevent spreading viral pharyngitis to others, avoid:  Mouth-to-mouth contact with others.  Sharing utensils for eating and drinking.  Coughing around others. SEEK MEDICAL CARE IF:   You are better in a few days, then become worse.  You have a fever or pain not helped by pain medicines.  There are any other changes that concern you. Document Released: 05/10/2005 Document Revised: 10/23/2011 Document Reviewed: 10/06/2010 Exit[employer] Patient Information 2014 [employer].  

## 2013-06-30 NOTE — Progress Notes (Signed)
HPI  Pt presents to the clinic today with c/o sore throat. This started about 3 days ago. He did have a runny nose last week. He has not taken anything OTC. He denies fever, chills or body aches. He denies any allergy symptoms. He has not had sick contacts. He is a current smoker.  Review of Systems      Past Medical History  Diagnosis Date  . Habitual alcohol use   . TIA (transient ischemic attack) 06/2009    transient RUE weakness, dysarthria (thought L hemispheric TIA)  . Hypertension   . Hyperlipidemia   . Dementia     mild organic brain syndrome  . History of chicken pox   . Hearing loss     pending hearing aides  . Smoker   . COPD (chronic obstructive pulmonary disease)     from smoking history however spirometry 02/2009: FVC 85%, FEV1 100%, ratio 0.95, WNL  . PAD (peripheral artery disease) 01/2013    per ABI    Family History  Problem Relation Age of Onset  . Cancer Brother     lung  . Cancer Brother     prostate  . Cancer Brother     stomach  . Stroke Brother   . Diabetes Mother   . Diabetes Brother   . Alzheimer's disease Brother   . Dementia Father   . CAD Neg Hx     History   Social History  . Marital Status: Unknown    Spouse Name: N/A    Number of Children: N/A  . Years of Education: N/A   Occupational History  . Not on file.   Social History Main Topics  . Smoking status: Current Every Day Smoker -- 1.00 packs/day for 50 years    Types: Cigarettes  . Smokeless tobacco: Never Used  . Alcohol Use: Yes     Comment: 3 beers/daily  . Drug Use: No  . Sexual Activity: Not on file   Other Topics Concern  . Not on file   Social History Narrative   Widower.  Wife died from lung cancer   Lives alone   Occupation: retired, used to work in [employer] park manager   Edu: HS   Activity: no regular exercise, occasional walking   Diet: some water, fruits some, occasional vegetables      Advanced directives: HCPOA is Ralene Cork   No prolonged life support    See scanned form    No Known Allergies   Constitutional: Denies headache, fatigue, fever or abrupt weight changes.  HEENT:  Positive sore throat. Denies eye redness, eye pain, pressure behind the eyes, facial pain, nasal congestion, ear pain, ringing in the ears, wax buildup, runny nose or bloody nose. Respiratory: Denies cough,  difficulty breathing or shortness of breath.  Cardiovascular: Denies chest pain, chest tightness, palpitations or swelling in the hands or feet.   No other specific complaints in a complete review of systems (except as listed in HPI above).  Objective:   BP 120/70  Pulse 70  Temp(Src) 98.2 F (36.8 C) (Oral)  Ht 5' 5.5" (1.664 m)  Wt 123 lb (55.792 kg)  BMI 20.15 kg/m2 Wt Readings from Last 3 Encounters:  06/30/13 123 lb (55.792 kg)  01/14/13 120 lb 4 oz (54.545 kg)  12/04/12 121 lb 8 oz (55.112 kg)     General: Appears his stated age, well developed, well nourished in NAD. HEENT: Head: normal shape and size; Eyes: sclera white, no icterus, conjunctiva pink, PERRLA  and EOMs intact; Ears: Tm's gray and intact, normal light reflex; Nose: mucosa pink and moist, septum midline; Throat/Mouth:. Teeth present, mucosa erythematous and moist, no exudate noted, no lesions or ulcerations noted.  Neck: Neck supple, trachea midline. No massses, lumps or thyromegaly present.  Cardiovascular: Normal rate and rhythm. S1,S2 noted.  No murmur, rubs or gallops noted. No JVD or BLE edema. No carotid bruits noted. Pulmonary/Chest: Normal effort and positive vesicular breath sounds. No respiratory distress. No wheezes, rales or ronchi noted.      Assessment & Plan:   Acute pharyngitis, likely viral:  Get some rest and drink plenty of water Do salt water gargles for the sore throat Advil for pain and swelling Monitor for fever or more pain with swallowing  RTC as needed or if symptoms persist.

## 2013-09-12 ENCOUNTER — Other Ambulatory Visit: Payer: Self-pay | Admitting: Family Medicine

## 2013-09-25 ENCOUNTER — Other Ambulatory Visit: Payer: Self-pay | Admitting: Family Medicine

## 2013-12-25 ENCOUNTER — Other Ambulatory Visit: Payer: Self-pay | Admitting: Family Medicine

## 2014-01-13 ENCOUNTER — Other Ambulatory Visit: Payer: Self-pay | Admitting: Family Medicine

## 2014-02-11 ENCOUNTER — Other Ambulatory Visit: Payer: Self-pay | Admitting: Family Medicine

## 2014-03-10 ENCOUNTER — Other Ambulatory Visit: Payer: Self-pay | Admitting: Family Medicine

## 2014-04-15 ENCOUNTER — Other Ambulatory Visit: Payer: Self-pay | Admitting: *Deleted

## 2014-04-22 ENCOUNTER — Other Ambulatory Visit: Payer: Self-pay | Admitting: Family Medicine

## 2014-04-23 ENCOUNTER — Other Ambulatory Visit: Payer: Self-pay | Admitting: Family Medicine

## 2014-04-27 ENCOUNTER — Other Ambulatory Visit: Payer: Self-pay | Admitting: Family Medicine

## 2014-04-27 ENCOUNTER — Other Ambulatory Visit: Payer: Self-pay

## 2014-04-27 MED ORDER — DONEPEZIL HCL 10 MG PO TABS: ORAL_TABLET | ORAL | Status: DC

## 2014-04-27 NOTE — Telephone Encounter (Signed)
Shaun Contreras pts daughter left v/m requesting refill donepezil; pt has CPX scheduled on 04/30/14.Please advise.[facility].

## 2014-04-30 ENCOUNTER — Encounter: Payer: Self-pay | Admitting: Family Medicine

## 2014-04-30 ENCOUNTER — Ambulatory Visit (INDEPENDENT_AMBULATORY_CARE_PROVIDER_SITE_OTHER): Payer: Medicare Other | Admitting: Family Medicine

## 2014-04-30 VITALS — BP 118/58 | HR 64 | Temp 98.2°F | Ht 65.25 in | Wt 130.2 lb

## 2014-04-30 DIAGNOSIS — J449 Chronic obstructive pulmonary disease, unspecified: Secondary | ICD-10-CM

## 2014-04-30 DIAGNOSIS — I6350 Cerebral infarction due to unspecified occlusion or stenosis of unspecified cerebral artery: Secondary | ICD-10-CM

## 2014-04-30 DIAGNOSIS — E785 Hyperlipidemia, unspecified: Secondary | ICD-10-CM

## 2014-04-30 DIAGNOSIS — I10 Essential (primary) hypertension: Secondary | ICD-10-CM

## 2014-04-30 DIAGNOSIS — Z7289 Other problems related to lifestyle: Secondary | ICD-10-CM

## 2014-04-30 DIAGNOSIS — I639 Cerebral infarction, unspecified: Secondary | ICD-10-CM

## 2014-04-30 DIAGNOSIS — Z23 Encounter for immunization: Secondary | ICD-10-CM

## 2014-04-30 DIAGNOSIS — F1010 Alcohol abuse, uncomplicated: Secondary | ICD-10-CM

## 2014-04-30 DIAGNOSIS — F0390 Unspecified dementia without behavioral disturbance: Secondary | ICD-10-CM

## 2014-04-30 DIAGNOSIS — F1090 Alcohol use, unspecified, uncomplicated: Secondary | ICD-10-CM

## 2014-04-30 DIAGNOSIS — Z0000 Encounter for general adult medical examination without abnormal findings: Secondary | ICD-10-CM

## 2014-04-30 DIAGNOSIS — Z1211 Encounter for screening for malignant neoplasm of colon: Secondary | ICD-10-CM

## 2014-04-30 DIAGNOSIS — H9190 Unspecified hearing loss, unspecified ear: Secondary | ICD-10-CM

## 2014-04-30 DIAGNOSIS — F17200 Nicotine dependence, unspecified, uncomplicated: Secondary | ICD-10-CM

## 2014-04-30 DIAGNOSIS — I739 Peripheral vascular disease, unspecified: Secondary | ICD-10-CM

## 2014-04-30 DIAGNOSIS — H9193 Unspecified hearing loss, bilateral: Secondary | ICD-10-CM

## 2014-04-30 MED ORDER — PRAVASTATIN SODIUM 40 MG PO TABS: ORAL_TABLET | ORAL | Status: DC

## 2014-04-30 MED ORDER — LISINOPRIL 5 MG PO TABS: ORAL_TABLET | ORAL | Status: DC

## 2014-04-30 MED ORDER — DONEPEZIL HCL 5 MG PO TABS: ORAL_TABLET | ORAL | Status: DC

## 2014-04-30 NOTE — Progress Notes (Signed)
Pre visit review using our clinic review tool, if applicable. No additional management support is needed unless otherwise documented below in the visit note. 

## 2014-04-30 NOTE — Addendum Note (Signed)
Addended by: Shon Millet on: 04/30/2014 10:39 AM   Modules accepted: Orders

## 2014-04-30 NOTE — Assessment & Plan Note (Signed)
Denies claudication sxs. Continue aspirin  daily.

## 2014-04-30 NOTE — Progress Notes (Signed)
BP 118/58  Pulse 64  Temp(Src) 98.2 F (36.8 C) (Oral)  Ht 5' 5.25" (1.657 m)  Wt 130 lb 4 oz (59.081 kg)  BMI 21.52 kg/m2  SpO2 99%   CC: medicare wellness  Subjective:    Patient ID: Shaun Contreras, male    DOB: 16-Nov-1934, 79 y.o.   MRN: [id number]  HPI: Shaun Contreras is a 79 y.o. male presenting on 04/30/2014 for Annual Exam   Smoker - 1 ppd. Working on cutting back.  EtOH - 3 beers/day max per patient. Per daughter 6 beers/day. Denies hangovers or trouble with law. No h/o alcohol withdrawal. Can go days without drinking.   Memory - mild dementia worse at night time. Doesn't get lost. Was taking aricept. Off med for last 6-7 days. Worried may be causing dizziness.   bp always stable on lisinopril 5m daily. BP Readings from Last 3 Encounters:  04/30/14 118/58  06/30/13 120/70  01/14/13 118/60    [facility]  Hearing screen - wears hearing aides. Daughter notices worsening hearing recently.  1 fall in past year - attributed to aricept so he stopped taking med. Dizziness when he bent over to pick up object from the floor.  Denies depression/anhedonia, sadness.  Wt Readings from Last 3 Encounters:  04/30/14 130 lb 4 oz (59.081 kg)  06/30/13 123 lb (55.792 kg)  01/14/13 120 lb 4 oz (54.545 kg)   Body mass index is 21.52 kg/(m^2).  Preventative: Colon cancer screening - no blood in stool. Stool kit today.  Prostate cancer screening - states h/o enlarged prostate. Brother with h/o prostate cancer. Last year normal exam. Declines screening this year. Flu shot - doesn't get  Tetanus - unsure, maybe around 2009.  Pneumovax - 11/2012. prevnar today Shingles - declines Advanced directives: HCPOA is LMeredith Contreras No prolonged life support.  Widower. Wife died from lung cancer  Lives alone, daughter lives 15 min away Occupation: retired, used to work in aPharmacologist Edu: HS  Activity: no regular exercise, occasional walking  Diet: some  water, fruits some, occasional vegetables   Relevant past medical, surgical, family and social history reviewed and updated as indicated.  Allergies and medications reviewed and updated. Current Outpatient Prescriptions on File Prior to Visit  Medication Sig  . aspirin EC 325 MG tablet Take 325 mg by mouth daily.  . Cholecalciferol (VITAMIN D3) 2000 UNITS TABS Take 1 tablet by mouth daily.   No current facility-administered medications on file prior to visit.    Review of Systems Per HPI unless specifically indicated above    Objective:    BP 118/58  Pulse 64  Temp(Src) 98.2 F (36.8 C) (Oral)  Ht 5' 5.25" (1.657 m)  Wt 130 lb 4 oz (59.081 kg)  BMI 21.52 kg/m2  SpO2 99%  Physical Exam  Nursing note and vitals reviewed. Constitutional: He is oriented to person, place, and time. He appears well-developed and well-nourished. No distress.  HENT:  Head: Normocephalic and atraumatic.  Right Ear: Hearing, tympanic membrane, external ear and ear canal normal.  Left Ear: Hearing, tympanic membrane, external ear and ear canal normal.  Nose: Nose normal.  Mouth/Throat: Uvula is midline, oropharynx is clear and moist and mucous membranes are normal. No oropharyngeal exudate, posterior oropharyngeal edema or posterior oropharyngeal erythema.  Eyes: Conjunctivae and EOM are normal. Pupils are equal, round, and reactive to light. No scleral icterus.  Neck: Normal range of motion. Neck supple. Carotid bruit is not present.  No thyromegaly present.  Cardiovascular: Normal rate, regular rhythm, normal heart sounds and intact distal pulses.   No murmur heard. Pulses:      Radial pulses are 2+ on the right side, and 2+ on the left side.  Pulmonary/Chest: Effort normal and breath sounds normal. No respiratory distress. He has no wheezes. He has no rales.  Abdominal: Soft. Bowel sounds are normal. He exhibits no distension and no mass. There is no tenderness. There is no rebound and no guarding.    Genitourinary:  Rectal declined  Musculoskeletal: Normal range of motion. He exhibits no edema.  Lymphadenopathy:    He has no cervical adenopathy.  Neurological: He is alert and oriented to person, place, and time.  CN grossly intact, station and gait intact Recall 3/3  Calculation 4/5 serial 7s, 5/5 [id number]  Skin: Skin is warm and dry. No rash noted.  Psychiatric: He has a normal mood and affect. His behavior is normal. Judgment and thought content normal.       Assessment & Plan:   Problem List Items Addressed This Visit   Smoker     Precontemplative. Continue to encourage cessation. Discussed screening lung cancer chest CT - pt will consider this for future appt.    PAD (peripheral artery disease)     Denies claudication sxs. Continue aspirin 380m daily.    Relevant Medications      lisinopril (PRINIVIL,ZESTRIL) tablet      pravastatin (PRAVACHOL) tablet   Medicare annual wellness visit, subsequent - Primary     I have personally reviewed the Medicare Annual Wellness questionnaire and have noted 1. The patient's medical and social history 2. Their use of alcohol, tobacco or illicit drugs 3. Their current medications and supplements 4. The patient's functional ability including ADL's, fall risks, home safety risks and hearing or visual impairment. 5. Diet and physical activity 6. Evidence for depression or mood disorders The patients weight, height, BMI have been recorded in the chart.  Hearing and vision has been addressed. I have made referrals, counseling and provided education to the patient based review of the above and I have provided the pt with a written personalized care plan for preventive services. Provider list updated - see scanned questionairre. Advanced directives discussed: daughter is HCPOA. No prolonged life support.  Reviewed preventative protocols and updated unless pt declined.    Hypertension     Chronic, stable. Anticipate should do well with  lower lisinopril dose -sent in 537mdaily.    Relevant Medications      lisinopril (PRINIVIL,ZESTRIL) tablet      pravastatin (PRAVACHOL) tablet   Hyperlipidemia     Chronic, continue pravastatin. Check FLP when returns fasting.    Relevant Medications      lisinopril (PRINIVIL,ZESTRIL) tablet      pravastatin (PRAVACHOL) tablet   Hearing loss     Deteriorated - suggested f/u with audiology    Habitual alcohol use     ~3beer/day. Discussed possible memory impairment from drinking    Dementia     Mild, some evident on memory questions on exam today (recall 1/3 with cue). Pt concerned aricept causing dizziness - so will decrease to 11m16maily.    Relevant Medications      donepezil (ARICEPT) tablet   CVA (cerebral infarction)     Continue ASA 3211m42mily.    COPD (chronic obstructive pulmonary disease)    Other Visit Diagnoses   Special screening for malignant neoplasms, colon  Relevant Orders       Fecal occult blood, imunochemical        Follow up plan: Return in about 1 year (around 05/01/2015), or as needed, for medicare wellness visit.

## 2014-04-30 NOTE — Assessment & Plan Note (Signed)
Continue ASA  daily.

## 2014-04-30 NOTE — Assessment & Plan Note (Signed)
I have personally reviewed the Medicare Annual Wellness questionnaire and have noted 1. The patient's medical and social history 2. Their use of alcohol, tobacco or illicit drugs 3. Their current medications and supplements 4. The patient's functional ability including ADL's, fall risks, home safety risks and hearing or visual impairment. 5. Diet and physical activity 6. Evidence for depression or mood disorders The patients weight, height, BMI have been recorded in the chart.  Hearing and vision has been addressed. I have made referrals, counseling and provided education to the patient based review of the above and I have provided the pt with a written personalized care plan for preventive services. Provider list updated - see scanned questionairre. Advanced directives discussed: daughter is HCPOA. No prolonged life support.  Reviewed preventative protocols and updated unless pt declined.

## 2014-04-30 NOTE — Assessment & Plan Note (Signed)
~  3beer/day. Discussed possible memory impairment from drinking

## 2014-04-30 NOTE — Assessment & Plan Note (Signed)
Mild, some evident on memory questions on exam today (recall 1/3 with cue). Pt concerned aricept causing dizziness - so will decrease to  daily.

## 2014-04-30 NOTE — Assessment & Plan Note (Signed)
Chronic, continue pravastatin. Check FLP when returns fasting.

## 2014-04-30 NOTE — Patient Instructions (Addendum)
prevnar today Call to schedule eye exam. Return next week fasting for labwork. Pass by lab to pick up stool kit. Let's try lower aricept dose to 64m daily. Let's try lower lisinopril dose to 567mdaily. Good to see you today, call us[place] quesitons.

## 2014-04-30 NOTE — Assessment & Plan Note (Signed)
Chronic, stable. Anticipate should do well with lower lisinopril dose -sent in  daily.

## 2014-04-30 NOTE — Assessment & Plan Note (Signed)
Precontemplative. Continue to encourage cessation. Discussed screening lung cancer chest CT - pt will consider this for future appt.

## 2014-04-30 NOTE — Assessment & Plan Note (Signed)
Deteriorated - suggested f/u with audiology

## 2014-05-12 ENCOUNTER — Other Ambulatory Visit: Payer: Self-pay | Admitting: Family Medicine

## 2014-12-03 ENCOUNTER — Other Ambulatory Visit (HOSPITAL_COMMUNITY): Payer: Self-pay | Admitting: Cardiology

## 2014-12-03 ENCOUNTER — Telehealth: Payer: Self-pay | Admitting: Family Medicine

## 2014-12-03 ENCOUNTER — Encounter: Payer: Self-pay | Admitting: Family Medicine

## 2014-12-03 ENCOUNTER — Ambulatory Visit (INDEPENDENT_AMBULATORY_CARE_PROVIDER_SITE_OTHER): Payer: Medicare Other | Admitting: Family Medicine

## 2014-12-03 VITALS — BP 132/70 | HR 74 | Temp 97.3°F | Wt 135.0 lb

## 2014-12-03 DIAGNOSIS — M25579 Pain in unspecified ankle and joints of unspecified foot: Secondary | ICD-10-CM | POA: Diagnosis not present

## 2014-12-03 DIAGNOSIS — I739 Peripheral vascular disease, unspecified: Secondary | ICD-10-CM

## 2014-12-03 NOTE — Telephone Encounter (Signed)
Tried to call number given to me by [phone].  No answer. Unable to leave VM.  I am leaving for the day so please call her back.  This is just a repeat of the arterial duplex doppler he had done two years ago for PAD.

## 2014-12-03 NOTE — Progress Notes (Signed)
Subjective:   Patient ID: Shaun Contreras, male    DOB: Jun 24, 1935, [age] y.o.   MRN: [id number]  Shaun Contreras is a pleasant 80 y.o. year old male pt of Dr. Reece AgarG, new to me, who presents to clinic today with his daughter for Foot Pain  on 12/03/2014  HPI:  Bilateral foot pain for over a month.  Has h/o PAD and is a smoker.  Chart reviewed.  Feels like his feet throb at times.  They don't feel colder than usual.  Pain does seem worse when he is walking.  Not sure if it is relieved by rest.  LE extremity arterial dupplex in 01/2014 showed quite moderate bilateral disease.  Has not been seeing a vascular surgeon. Taking ASA and statin.  Current Outpatient Prescriptions on File Prior to Visit  Medication Sig Dispense Refill  . aspirin EC 325 MG tablet Take 325 mg by mouth daily.    . Cholecalciferol (VITAMIN D3) 2000 UNITS TABS Take 1 tablet by mouth daily.    Marland Kitchen. donepezil (ARICEPT) 5 MG tablet TAKE 1 TABLET BY MOUTH ONCE A DAY 90 tablet 3  . lisinopril (PRINIVIL,ZESTRIL) 5 MG tablet TAKE 1 TABLET BY MOUTH ONCE A DAY 90 tablet 3  . pravastatin (PRAVACHOL) 40 MG tablet TAKE 1 TABLET BY MOUTH ONCE EVERY EVENING 30 tablet 11   No current facility-administered medications on file prior to visit.    No Known Allergies  Past Medical History  Diagnosis Date  . Habitual alcohol use   . TIA (transient ischemic attack) 06/2009    transient RUE weakness, dysarthria (thought L hemispheric TIA)  . Hypertension   . Hyperlipidemia   . Dementia     mild organic brain syndrome  . History of chicken pox   . Hearing loss     pending hearing aides  . Smoker   . COPD (chronic obstructive pulmonary disease)     from smoking history however spirometry 02/2009: FVC 85%, FEV1 100%, ratio 0.95, WNL  . PAD (peripheral artery disease) 01/2013    per ABI  . History of shingles     Past Surgical History  Procedure Laterality Date  . Tonsillectomy      as teen  . Dental surgery  2014  . Cataract  extraction, bilateral Bilateral   . Abi/duplex Bilateral 2014    R 0.73, L 0.84, 50% R SFA stenosis, <50% L SFA stenosis    Family History  Problem Relation Age of Onset  . Cancer Brother     lung  . Cancer Brother     prostate  . Cancer Brother     stomach  . Stroke Brother   . Diabetes Mother   . Diabetes Brother   . Alzheimer's disease Brother   . Dementia Father   . CAD Neg Hx     History   Social History  . Marital Status: Unknown    Spouse Name: N/A  . Number of Children: N/A  . Years of Education: N/A   Occupational History  . Not on file.   Social History Main Topics  . Smoking status: Current Every Day Smoker -- 1.00 packs/day for 50 years    Types: Cigarettes  . Smokeless tobacco: Never Used  . Alcohol Use: Yes     Comment: 3 beers/daily  . Drug Use: No  . Sexual Activity: Not on file   Other Topics Concern  . Not on file   Social History Narrative   Widower.  Wife died  from lung cancer   Lives alone   Occupation: retired, used to work in Theme park manager   Edu: HS   Activity: no regular exercise, occasional walking   Diet: some water, fruits some, occasional vegetables      Advanced directives: HCPOA is Ralene Cork   No prolonged life support   See scanned form   The PMH, PSH, Social History, Family History, Medications, and allergies have been reviewed in [facility], and have been updated if relevant.   Review of Systems  Respiratory: Negative.   Cardiovascular: Negative.   Musculoskeletal: Positive for joint swelling and arthralgias. Negative for gait problem.  Neurological: Negative for tremors, weakness and numbness.  All other systems reviewed and are negative.      Objective:    BP 132/70 mmHg  Pulse 74  Temp(Src) 97.3 F (36.3 C) (Oral)  Wt 135 lb (61.236 kg)  SpO2 97%   Physical Exam  Constitutional: He is oriented to person, place, and time. He appears well-developed and well-nourished. No distress.  Smells strongly of  cigarette smoke  HENT:  Head: Normocephalic.  Eyes: Conjunctivae are normal.  Neck: Normal range of motion.  Cardiovascular: Normal rate.   Pulmonary/Chest: Effort normal.  Musculoskeletal: Normal range of motion.  Decreased pedal pulses bilaterally- but present Multiple spider veins No LE edema  Neurological: He is alert and oriented to person, place, and time. No cranial nerve deficit.  Skin: Skin is warm and dry.  Nursing note and vitals reviewed.         Assessment & Plan:   PAD (peripheral artery disease) - Plan: Lower Extremity Arterial Duplex No Follow-up on file.

## 2014-12-03 NOTE — Telephone Encounter (Signed)
Sorry.  I didn't realize I had to start with ABI since I assumed it would have been abnormal.  Thanks!

## 2014-12-03 NOTE — Telephone Encounter (Signed)
Clarified with gina. She will start with ABI and if abnormal proceed with duplex.

## 2014-12-03 NOTE — Telephone Encounter (Signed)
jina wanted to speak with dr Dayton Martesaron or her medical assistant  About ms Chaidez. She wanted to clarify something before she does the procedure tomorrow

## 2014-12-03 NOTE — Telephone Encounter (Signed)
Attempted to contact pt; unable to reach or leave vm

## 2014-12-03 NOTE — Patient Instructions (Signed)
Please stop by to see Shaun Contreras on your wa yout.  It was nice to meet you.

## 2014-12-03 NOTE — Assessment & Plan Note (Signed)
New- progressive. Likely multifactorial- stood on concrete floors for years but PAD is most likely the biggest culprit. Place order for repeat dupplex arterial doppler- almost two years since his last study. May need surgery or other medical tx at this point. The patient indicates understanding of these issues and agrees with the plan.

## 2014-12-03 NOTE — Progress Notes (Signed)
Pre visit review using our clinic review tool, if applicable. No additional management support is needed unless otherwise documented below in the visit note. 

## 2014-12-04 ENCOUNTER — Ambulatory Visit (INDEPENDENT_AMBULATORY_CARE_PROVIDER_SITE_OTHER): Payer: Medicare Other | Admitting: Cardiology

## 2014-12-04 DIAGNOSIS — I739 Peripheral vascular disease, unspecified: Secondary | ICD-10-CM | POA: Diagnosis not present

## 2014-12-04 DIAGNOSIS — R200 Anesthesia of skin: Secondary | ICD-10-CM | POA: Diagnosis not present

## 2014-12-08 ENCOUNTER — Other Ambulatory Visit: Payer: Self-pay | Admitting: Family Medicine

## 2014-12-08 DIAGNOSIS — I739 Peripheral vascular disease, unspecified: Secondary | ICD-10-CM

## 2014-12-09 NOTE — Progress Notes (Signed)
I am unable to close this chart.

## 2014-12-13 DIAGNOSIS — E538 Deficiency of other specified B group vitamins: Secondary | ICD-10-CM

## 2014-12-13 HISTORY — DX: Deficiency of other specified B group vitamins: E53.8

## 2015-01-06 ENCOUNTER — Ambulatory Visit (INDEPENDENT_AMBULATORY_CARE_PROVIDER_SITE_OTHER): Payer: Medicare Other | Admitting: Family Medicine

## 2015-01-06 ENCOUNTER — Encounter: Payer: Self-pay | Admitting: Family Medicine

## 2015-01-06 VITALS — BP 132/78 | HR 58 | Temp 97.8°F | Wt 132.2 lb

## 2015-01-06 DIAGNOSIS — I639 Cerebral infarction, unspecified: Secondary | ICD-10-CM | POA: Diagnosis not present

## 2015-01-06 DIAGNOSIS — I10 Essential (primary) hypertension: Secondary | ICD-10-CM | POA: Diagnosis not present

## 2015-01-06 DIAGNOSIS — E785 Hyperlipidemia, unspecified: Secondary | ICD-10-CM | POA: Diagnosis not present

## 2015-01-06 DIAGNOSIS — H9193 Unspecified hearing loss, bilateral: Secondary | ICD-10-CM

## 2015-01-06 DIAGNOSIS — F1090 Alcohol use, unspecified, uncomplicated: Secondary | ICD-10-CM

## 2015-01-06 DIAGNOSIS — I739 Peripheral vascular disease, unspecified: Secondary | ICD-10-CM

## 2015-01-06 DIAGNOSIS — F17200 Nicotine dependence, unspecified, uncomplicated: Secondary | ICD-10-CM

## 2015-01-06 DIAGNOSIS — F1010 Alcohol abuse, uncomplicated: Secondary | ICD-10-CM

## 2015-01-06 DIAGNOSIS — F0390 Unspecified dementia without behavioral disturbance: Secondary | ICD-10-CM

## 2015-01-06 DIAGNOSIS — N289 Disorder of kidney and ureter, unspecified: Secondary | ICD-10-CM | POA: Diagnosis not present

## 2015-01-06 DIAGNOSIS — Z720 Tobacco use: Secondary | ICD-10-CM

## 2015-01-06 DIAGNOSIS — Z7289 Other problems related to lifestyle: Secondary | ICD-10-CM

## 2015-01-06 LAB — CBC WITH DIFFERENTIAL/PLATELET
BASOS ABS: 0 10*3/uL (ref 0.0–0.1)
BASOS PCT: 0.3 % (ref 0.0–3.0)
EOS PCT: 1.3 % (ref 0.0–5.0)
Eosinophils Absolute: 0.1 10*3/uL (ref 0.0–0.7)
HEMATOCRIT: 40 % (ref 39.0–52.0)
Hemoglobin: 13.7 g/dL (ref 13.0–17.0)
LYMPHS PCT: 13.7 % (ref 12.0–46.0)
Lymphs Abs: 1.1 10*3/uL (ref 0.7–4.0)
MCHC: 34.2 g/dL (ref 30.0–36.0)
MCV: 92.9 fl (ref 78.0–100.0)
MONO ABS: 0.8 10*3/uL (ref 0.1–1.0)
Monocytes Relative: 10.1 % (ref 3.0–12.0)
NEUTROS ABS: 6.1 10*3/uL (ref 1.4–7.7)
NEUTROS PCT: 74.6 % (ref 43.0–77.0)
Platelets: 269 10*3/uL (ref 150.0–400.0)
RBC: 4.31 Mil/uL (ref 4.22–5.81)
RDW: 13.1 % (ref 11.5–15.5)
WBC: 8.2 10*3/uL (ref 4.0–10.5)

## 2015-01-06 LAB — RENAL FUNCTION PANEL
Albumin: 4.1 g/dL (ref 3.5–5.2)
BUN: 17 mg/dL (ref 6–23)
CO2: 26 meq/L (ref 19–32)
Calcium: 9.1 mg/dL (ref 8.4–10.5)
Chloride: 103 mEq/L (ref 96–112)
Creatinine, Ser: 1.18 mg/dL (ref 0.40–1.50)
GFR: 63.43 mL/min (ref >60.00)
GLUCOSE: 91 mg/dL (ref 70–99)
POTASSIUM: 4.5 meq/L (ref 3.5–5.1)
Phosphorus: 3.1 mg/dL (ref 2.3–4.6)
Sodium: 136 mEq/L (ref 135–145)

## 2015-01-06 LAB — LDL CHOLESTEROL, DIRECT: LDL DIRECT: 92 mg/dL

## 2015-01-06 LAB — TSH: TSH: 0.58 u[IU]/mL (ref 0.35–4.50)

## 2015-01-06 LAB — VITAMIN B12: VITAMIN B 12: 126 pg/mL — AB (ref 211–911)

## 2015-01-06 MED ORDER — MEMANTINE HCL ER 7 MG PO CP24: 7.0000 mg | ORAL_CAPSULE | Freq: Every day | ORAL | Status: DC

## 2015-01-06 NOTE — Progress Notes (Signed)
Pre visit review using our clinic review tool, if applicable. No additional management support is needed unless otherwise documented below in the visit note. 

## 2015-01-06 NOTE — Assessment & Plan Note (Signed)
H/o TIAs in past, no current sxs.

## 2015-01-06 NOTE — Assessment & Plan Note (Signed)
Denies issues with this, endorses 2-3 beers/day.

## 2015-01-06 NOTE — Assessment & Plan Note (Signed)
Continue to encourage cessation. Action phase - down to 1/2 ppd.

## 2015-01-06 NOTE — Assessment & Plan Note (Signed)
Pending stent placement by vascular surgery.

## 2015-01-06 NOTE — Progress Notes (Signed)
BP 132/78 mmHg  Pulse 58  Temp(Src) 97.8 F (36.6 C) (Oral)  Wt 132 lb 4 oz (59.988 kg)  SpO2 99%   CC: worsening memory loss and confusion  Subjective:    Patient ID: Shaun Contreras, male    DOB: 1935/01/23, 80 y.o.   MRN: [id number]  HPI: Shaun Contreras is a 80 y.o. male presenting on 01/06/2015 for Memory Loss   PAD - worsening recently. Referred to vascular surgery last month. Seen yesterday - pending lower extremity stents R then L. Dr Wyn Quaker.   Has hearing aides but pt doesn't like to use.   Memory - mild dementia worse at night time. Doesn't get lost. Worried aricpet  caused dizziness. Has tolerated  dose well. Forgot to call daughter on her birthday. Increased confusion when out of his routine. No sudden loss of function. Pt denies worsening at night time. Denies dizziness, imbalance. Denies urinary incontinence. Denies trouble recognizing family members.   Brother with h/o alz, father with h/o dementia.   Mood - denies overwhelming depression/sadness/anxiety.  EtOH - 2 beers per day.  Smoking - slowly cutting back. 1/2 ppd.  Discussing assisted living with family.   Geriatric Assessment: Mental Status Exam: 20/30, predominant trouble with attention and recall, some trouble with orientation to place. Clock Drawing Score: able to place #s in correct location but unable to signal time requested. Able to read his own watch well.     Relevant past medical, surgical, family and social history reviewed and updated as indicated. Interim medical history since our last visit reviewed. Allergies and medications reviewed and updated. Current Outpatient Prescriptions on File Prior to Visit  Medication Sig  . aspirin EC 325 MG tablet Take 325 mg by mouth daily.  . Cholecalciferol (VITAMIN D3) 2000 UNITS TABS Take 1 tablet by mouth daily.  Marland Kitchen donepezil (ARICEPT) 5 MG tablet TAKE 1 TABLET BY MOUTH ONCE A DAY  . lisinopril (PRINIVIL,ZESTRIL) 5 MG tablet TAKE 1 TABLET BY MOUTH ONCE A  DAY  . pravastatin (PRAVACHOL) 40 MG tablet TAKE 1 TABLET BY MOUTH ONCE EVERY EVENING   No current facility-administered medications on file prior to visit.    Review of Systems Per HPI unless specifically indicated above     Objective:    BP 132/78 mmHg  Pulse 58  Temp(Src) 97.8 F (36.6 C) (Oral)  Wt 132 lb 4 oz (59.988 kg)  SpO2 99%  Wt Readings from Last 3 Encounters:  01/06/15 132 lb 4 oz (59.988 kg)  12/03/14 135 lb (61.236 kg)  04/30/14 130 lb 4 oz (59.081 kg)    Physical Exam  Constitutional: He is oriented to person, place, and time. He appears well-developed and well-nourished. No distress.  thin  HENT:  Head: Normocephalic and atraumatic.  Mouth/Throat: Oropharynx is clear and moist. No oropharyngeal exudate.  Eyes: Conjunctivae and EOM are normal. Pupils are equal, round, and reactive to light. No scleral icterus.  Neck: Normal range of motion. Neck supple. No thyromegaly present.  Cardiovascular: Normal rate, regular rhythm, normal heart sounds and intact distal pulses.   No murmur heard. Pulmonary/Chest: Effort normal and breath sounds normal. No respiratory distress. He has no wheezes. He has no rales.  Musculoskeletal: He exhibits no edema.  Lymphadenopathy:    He has no cervical adenopathy.  Neurological: He is alert and oriented to person, place, and time. He has normal strength. No cranial nerve deficit or sensory deficit. Coordination and gait normal.  Grip strength intact CN grossly intact MMSE  performed today.  Skin: Skin is warm and dry. No rash noted.  Psychiatric: He has a normal mood and affect.  Nursing note and vitals reviewed.      Assessment & Plan:   Problem List Items Addressed This Visit    CVA (cerebral infarction)    H/o TIAs in past, no current sxs.      Dementia - Primary    Progression noted today with MMSE 20/30. Discussed with patient and daughter.  Anticipate mixed alz + vasc dementia. No acute loss of function  consistent with significant vascular dementia. Will start namenda XR 7mg  daily, continue aricept 5mg  daily.  Discussed concerns with driving and memory/hearing. Discussed safety with driving. Advised he needs to wear hearing aides if he wants to continue to drive, advised no long distance driving, no night time driving. Check dementia labwork today including TSH, B12. RTC 4 mo f/u visit.      Relevant Medications   memantine (NAMENDA XR) 7 MG CP24 24 hr capsule   Other Relevant Orders   TSH   Vitamin B12   Habitual alcohol use    Denies issues with this, endorses 2-3 beers/day.      Hearing loss    Marked hearing loss without cerumen impaction. Advised f/u with audiologist to re-eval for hearing aides. Advised needs to wear aides if he wants to drive.      Hyperlipidemia   Relevant Orders   TSH   LDL Cholesterol, Direct   Hypertension   PAD (peripheral artery disease)    Pending stent placement by vascular surgery.      Smoker    Continue to encourage cessation. Action phase - down to 1/2 ppd.       Other Visit Diagnoses    Renal insufficiency        Relevant Orders    Renal function panel    CBC with Differential/Platelet        Follow up plan: Return in about 4 months (around 05/09/2015), or as needed, for follow up visit.

## 2015-01-06 NOTE — Assessment & Plan Note (Signed)
Marked hearing loss without cerumen impaction. Advised f/u with audiologist to re-eval for hearing aides. Advised needs to wear aides if he wants to drive.

## 2015-01-06 NOTE — Assessment & Plan Note (Addendum)
Progression noted today with MMSE 20/30. Discussed with patient and daughter.  Anticipate mixed alz + vasc dementia. No acute loss of function consistent with significant vascular dementia. Will start namenda XR 7mg  daily, continue aricept 5mg  daily.  Discussed concerns with driving and memory/hearing. Discussed safety with driving. Advised he needs to wear hearing aides if he wants to continue to drive, advised no long distance driving, no night time driving. Check dementia labwork today including TSH, B12. RTC 4 mo f/u visit.

## 2015-01-06 NOTE — Patient Instructions (Addendum)
Labwork today. Continue aricept 5mg  . Trial namenda XR for memory.  I do think there's progression of memory loss. Continue working on quitting smoking.  I'm hesitant about your driving with memory trouble.  Return in 4 months for follow up visit.

## 2015-01-07 ENCOUNTER — Other Ambulatory Visit: Payer: Self-pay | Admitting: Family Medicine

## 2015-01-07 ENCOUNTER — Telehealth: Payer: Self-pay | Admitting: *Deleted

## 2015-01-07 ENCOUNTER — Encounter: Payer: Self-pay | Admitting: Family Medicine

## 2015-01-07 DIAGNOSIS — E538 Deficiency of other specified B group vitamins: Secondary | ICD-10-CM | POA: Insufficient documentation

## 2015-01-07 MED ORDER — MEMANTINE HCL 5 MG PO TABS: 5.0000 mg | ORAL_TABLET | Freq: Two times a day (BID) | ORAL | Status: DC

## 2015-01-07 NOTE — Telephone Encounter (Signed)
Message left for daughter to return my call.

## 2015-01-07 NOTE — Telephone Encounter (Signed)
This family of medications will likely be expensive. Have sent immediate release form to price out. Alternatively daughter may print voucher online for [employer] xr (google it) for free 1 month trial but then I think it would have to be out of pocket and not run it through medicare.

## 2015-01-07 NOTE — Telephone Encounter (Signed)
Daughter called and left voicemail at Triage. Daughter said the new Rx you prescribed yesterday for his alzheimer's is to expensive and she would like a different medication sent, daughter request call back

## 2015-01-12 NOTE — Telephone Encounter (Signed)
Patient's daughter notified.

## 2015-01-18 ENCOUNTER — Ambulatory Visit
Admission: RE | Admit: 2015-01-18 | Discharge: 2015-01-18 | Disposition: A | Discharge status: Home / Self Care | Payer: Medicare Other | Source: Ambulatory Visit | Attending: Vascular Surgery | Admitting: Vascular Surgery

## 2015-01-18 ENCOUNTER — Telehealth: Payer: Self-pay | Admitting: Family Medicine

## 2015-01-18 ENCOUNTER — Encounter
Admission: RE | Disposition: A | Discharge status: Home / Self Care | Payer: Self-pay | Source: Ambulatory Visit | Attending: Vascular Surgery

## 2015-01-18 ENCOUNTER — Encounter: Payer: Self-pay | Admitting: *Deleted

## 2015-01-18 DIAGNOSIS — Z79899 Other long term (current) drug therapy: Secondary | ICD-10-CM | POA: Diagnosis not present

## 2015-01-18 DIAGNOSIS — Z7982 Long term (current) use of aspirin: Secondary | ICD-10-CM | POA: Diagnosis present

## 2015-01-18 DIAGNOSIS — F17210 Nicotine dependence, cigarettes, uncomplicated: Secondary | ICD-10-CM | POA: Insufficient documentation

## 2015-01-18 DIAGNOSIS — Z8673 Personal history of transient ischemic attack (TIA), and cerebral infarction without residual deficits: Secondary | ICD-10-CM | POA: Insufficient documentation

## 2015-01-18 DIAGNOSIS — I70213 Atherosclerosis of native arteries of extremities with intermittent claudication, bilateral legs: Secondary | ICD-10-CM | POA: Insufficient documentation

## 2015-01-18 HISTORY — PX: PERIPHERAL VASCULAR CATHETERIZATION: SHX172C

## 2015-01-18 LAB — CREATININE, SERUM
Creatinine, Ser: 1.36 mg/dL — ABNORMAL HIGH (ref 0.61–1.24)
GFR, EST AFRICAN AMERICAN: 56 mL/min — AB (ref >60)
GFR, EST NON AFRICAN AMERICAN: 48 mL/min — AB (ref >60)

## 2015-01-18 LAB — BUN: BUN: 19 mg/dL (ref 6–20)

## 2015-01-18 SURGERY — LOWER EXTREMITY ANGIOGRAPHY
Anesthesia: Moderate Sedation | Laterality: Right

## 2015-01-18 MED ORDER — MIDAZOLAM HCL 5 MG/5ML IJ SOLN
INTRAMUSCULAR | Status: AC
  Filled 2015-01-18: qty 5

## 2015-01-18 MED ORDER — HEPARIN (PORCINE) IN NACL 2-0.9 UNIT/ML-% IJ SOLN
INTRAMUSCULAR | Status: AC
  Filled 2015-01-18: qty 1000

## 2015-01-18 MED ORDER — HYDROMORPHONE HCL 1 MG/ML IJ SOLN: 0.5000 mg | INTRAMUSCULAR | Status: DC | PRN

## 2015-01-18 MED ORDER — IOHEXOL 300 MG/ML  SOLN
INTRAMUSCULAR | Status: DC | PRN
  Administered 2015-01-18: 85 mL via INTRA_ARTERIAL

## 2015-01-18 MED ORDER — FENTANYL CITRATE (PF) 100 MCG/2ML IJ SOLN
INTRAMUSCULAR | Status: AC
  Filled 2015-01-18: qty 2

## 2015-01-18 MED ORDER — LABETALOL HCL 5 MG/ML IV SOLN: 10.0000 mg | INTRAVENOUS | Status: DC | PRN

## 2015-01-18 MED ORDER — ATROPINE SULFATE 0.1 MG/ML IJ SOLN: 0.5000 mg | INTRAMUSCULAR | Status: DC | PRN

## 2015-01-18 MED ORDER — HYDROMORPHONE HCL 1 MG/ML IJ SOLN
1.0000 mg | Freq: Once | INTRAMUSCULAR | Status: DC
Start: 2015-01-18 — End: 2015-01-18

## 2015-01-18 MED ORDER — PHENOL 1.4 % MT LIQD: 1.0000 | OROMUCOSAL | Status: DC | PRN

## 2015-01-18 MED ORDER — ACETAMINOPHEN 325 MG RE SUPP: 325.0000 mg | RECTAL | Status: DC | PRN

## 2015-01-18 MED ORDER — ACETAMINOPHEN 325 MG PO TABS
325.0000 mg | ORAL_TABLET | ORAL | Status: DC | PRN
Start: 2015-01-18 — End: 2015-01-18

## 2015-01-18 MED ORDER — HEPARIN SODIUM (PORCINE) 1000 UNIT/ML IJ SOLN
INTRAMUSCULAR | Status: AC
Start: 2015-01-18 — End: 2015-01-18
  Filled 2015-01-18: qty 1

## 2015-01-18 MED ORDER — ONDANSETRON HCL 4 MG/2ML IJ SOLN: 4.0000 mg | INTRAMUSCULAR | Status: DC | PRN

## 2015-01-18 MED ORDER — CEFAZOLIN SODIUM 1-5 GM-% IV SOLN
INTRAVENOUS | Status: AC
  Filled 2015-01-18: qty 50

## 2015-01-18 MED ORDER — FENTANYL CITRATE (PF) 100 MCG/2ML IJ SOLN
INTRAMUSCULAR | Status: DC | PRN
  Administered 2015-01-18 (×4): 50 ug via INTRAVENOUS

## 2015-01-18 MED ORDER — HEPARIN SODIUM (PORCINE) 1000 UNIT/ML IJ SOLN
INTRAMUSCULAR | Status: DC | PRN
  Administered 2015-01-18: 4000 [IU] via INTRAVENOUS

## 2015-01-18 MED ORDER — MIDAZOLAM HCL 2 MG/2ML IJ SOLN
INTRAMUSCULAR | Status: DC | PRN
  Administered 2015-01-18: 1 mg via INTRAVENOUS
  Administered 2015-01-18 (×2): 2 mg via INTRAVENOUS

## 2015-01-18 MED ORDER — CEFAZOLIN SODIUM 1-5 GM-% IV SOLN
1.0000 g | Freq: Once | INTRAVENOUS | Status: AC
  Administered 2015-01-18: 1 g via INTRAVENOUS

## 2015-01-18 MED ORDER — METOPROLOL TARTRATE 1 MG/ML IV SOLN: 2.0000 mg | INTRAVENOUS | Status: DC | PRN

## 2015-01-18 MED ORDER — GUAIFENESIN-DM 100-10 MG/5ML PO SYRP: 15.0000 mL | ORAL_SOLUTION | ORAL | Status: DC | PRN

## 2015-01-18 MED ORDER — SODIUM CHLORIDE 0.9 % IV SOLN: INTRAVENOUS | Status: DC

## 2015-01-18 MED ORDER — ONDANSETRON HCL 4 MG/2ML IJ SOLN: 4.0000 mg | Freq: Four times a day (QID) | INTRAMUSCULAR | Status: DC | PRN

## 2015-01-18 MED ORDER — HYDRALAZINE HCL 20 MG/ML IJ SOLN: 5.0000 mg | INTRAMUSCULAR | Status: DC | PRN

## 2015-01-18 MED ORDER — LIDOCAINE-EPINEPHRINE (PF) 1 %-1:200000 IJ SOLN
INTRAMUSCULAR | Status: DC | PRN
  Administered 2015-01-18: 10 mL via INTRADERMAL

## 2015-01-18 MED ORDER — LIDOCAINE-EPINEPHRINE (PF) 1 %-1:200000 IJ SOLN
INTRAMUSCULAR | Status: AC
  Filled 2015-01-18: qty 30

## 2015-01-18 MED ORDER — SODIUM CHLORIDE 0.9 % IV SOLN
INTRAVENOUS | Status: DC
  Administered 2015-01-18: 11:00:00 via INTRAVENOUS

## 2015-01-18 MED ORDER — OXYCODONE-ACETAMINOPHEN 5-325 MG PO TABS: 1.0000 | ORAL_TABLET | ORAL | Status: DC | PRN

## 2015-01-18 SURGICAL SUPPLY — 18 items
BALLN LUTONIX 5X150X130 (BALLOONS) ×6
BALLN ULTRVRSE 2.5X300X150 (BALLOONS) ×3
BALLOON LUTONIX 5X150X130 (BALLOONS) ×2 IMPLANT
BALLOON ULTRVRSE 2.5X300X150 (BALLOONS) ×1 IMPLANT
CATH CXI SUPP ANG 4FR 135 (MICROCATHETER) ×1 IMPLANT
CATH CXI SUPP ANG 4FR 135CM (MICROCATHETER) ×3
CATH KMP 5.0X100 (CATHETERS) ×3 IMPLANT
CATH ROYAL FLUSH PIG 5F 70CM (CATHETERS) ×3 IMPLANT
DEVICE PRESTO INFLATION (MISCELLANEOUS) ×3 IMPLANT
DEVICE STARCLOSE SE CLOSURE (Vascular Products) ×3 IMPLANT
GLIDEWIRE ADV .035X260CM (WIRE) ×3 IMPLANT
PACK ANGIOGRAPHY (CUSTOM PROCEDURE TRAY) ×3 IMPLANT
SHEATH ANL2 6FRX45 HC (SHEATH) ×3 IMPLANT
SHEATH BRITE TIP 5FRX11 (SHEATH) ×3 IMPLANT
SYR MEDRAD MARK V 150ML (SYRINGE) ×3 IMPLANT
TUBING CONTRAST HIGH PRESS 72 (TUBING) ×3 IMPLANT
WIRE G V18X300CM (WIRE) ×3 IMPLANT
WIRE J 3MM .035X145CM (WIRE) ×3 IMPLANT

## 2015-01-18 NOTE — CV Procedure (Addendum)
[facility] Percutaneous Study/Intervention Procedural Note   Date of Surgery: 01/18/2015  Surgeon(s):DEW,JASON   Assistants:none  Pre-operative Diagnosis: PAD with claudication bilateral lower extremities  Post-operative diagnosis: Same  Procedure(s) Performed: 1. Ultrasound guidance for vascular access left femoral artery 2. Catheter placement into right dorsalis pedis artery from left femoral approach 3. Aortogram and selective right lower extremity angiogram 4. Percutaneous transluminal angioplasty of right anterior tibial artery with 2.5 mm diameter angioplasty balloon 5. Percutaneous transluminal angioplasty of right superficial femoral artery and above-knee popliteal artery with two 5 mm diameter by 15 cm length Lutonix drug-coated angioplasty balloons 6. StarClose closure device left femoral artery  EBL: Minimal  Indications: Patient is a 80 year old white male with short distance claudication of both lower extremities.. The patient has noninvasive study showing markedly reduced ABIs bilaterally and multilevel stenoses on duplex. The patient is brought in for angiography for further evaluation and potential treatment. Risks and benefits are discussed and informed consent is obtained  Procedure: The patient was identified and appropriate procedural time out was performed. The patient was then placed supine on the table and prepped and draped in the usual sterile fashion. Ultrasound was used to evaluate the left common femoral artery. It was patent . A digital ultrasound image was acquired. A Seldinger needle was used to access the left common femoral artery under direct ultrasound guidance and a permanent image was performed. A 0.035 J wire was advanced without resistance and a 5Fr sheath was placed. Pigtail catheter was placed into the aorta and an AP  aortogram was performed. This demonstrated normal right renal artery, the left renal artery appeared to be occluded, the aorta appeared to be slightly aneurysmal, but no stenosis was seen within the aorta or iliac segments. I then crossed the aortic bifurcation and advanced to the right femoral head. Selective right lower extremity angiogram was then performed. This demonstrated multiple areas of greater than 90% stenosis within the SFA in its mid and distal segments and the above-knee popliteal artery. The peroneal artery was continuous but was the only continuous runoff distally. The anterior tibial artery was occluded from its proximal segment to its distal segment but did reconstitute at the ankle. The posterior tibial artery was occluded without distal reconstitution identified. The patient was systemically heparinized with 4000 units of intravenous heparin and a 6 [place] Ansell sheath was then placed over the [employer] wire. I then used a Kumpe catheter and the advantage wire to cross through the SFA and popliteal stenoses and confirm intraluminal flow in the popliteal artery below the knee. Imaging then re-demonstrated the tibial stenoses as described above and were better opacified on the selective images. I then exchanged for a CXI catheter and a 0.018 wire and advanced through the anterior tibial occlusion and into the dorsalis pedis artery in the foot. The CXI catheter was used to confirm intraluminal flow at this point. I replaced the wire and elected to proceed with intervention at this point. The anterior tibial artery from its proximal segment into the percentiles pedis artery in the foot were treated with a 2.5 mm diameter angioplasty balloon with a total of 2 inflations. To treat the multiple areas of SFA stenoses and above-knee popliteal artery stenoses, two Lutonix drug-coated angioplasty balloons were used. These were both 5 mm in diameter by 15 cm in length. Inflations starting from just  above the knee to the proximal superficial femoral artery. Completion angiogram following interventions at both levels demonstrated a nonflow limiting  dissection near Hunter's canal with no greater than 20-25% residual stenosis identified throughout the SFA and popliteal intervention sites. The anterior tibial artery was continuous but the vessel was still small and likely spasmodic in the foot and ankle. The flow was markedly improved however and no residual stenosis was identified in the area of previous occlusion. I elected to terminate the procedure. The sheath was removed and StarClose closure device was deployed in the left femoral artery with excellent hemostatic result. The patient was taken to the recovery room in stable condition having tolerated the procedure well.  Findings:  Aortogram: Left renal artery appears occluded, aorta is mildly aneurysmal but not stenotic Right Lower Extremity: Multilevel SFA and above-knee popliteal artery stenoses of greater than 80-90%, anterior tibial artery occlusion with distal reconstitution, peroneal artery continuous.  Anterior tibial artery, SFA, and popliteal artery all significantly improved with angioplasty     Disposition: Patient was taken to the recovery room in stable condition having tolerated the procedure well.  Complications: None   Festus BarrenJason Dew, MD

## 2015-01-18 NOTE — H&P (Signed)
[facility] History & Physical Update  The patient was interviewed and re-examined.  The patient's previous History and Physical has been reviewed and is unchanged.  There is no change in the plan of care.  Valerie Cones, MD  01/18/2015, 8:09 AM

## 2015-01-18 NOTE — Telephone Encounter (Signed)
Patient's daughter,Laura, and her sister who is visiting from out of town to help with their father would like to speak to Dr.Gutierrez about patient's living conditions and possibly putting him in assisted living.

## 2015-01-18 NOTE — Discharge Instructions (Signed)

## 2015-01-18 NOTE — Telephone Encounter (Signed)
Spoke with daughter. Appt scheduled for 4pm tomorrow.

## 2015-01-19 ENCOUNTER — Encounter: Payer: Self-pay | Admitting: Vascular Surgery

## 2015-01-19 ENCOUNTER — Ambulatory Visit (INDEPENDENT_AMBULATORY_CARE_PROVIDER_SITE_OTHER): Payer: Medicare Other | Admitting: Family Medicine

## 2015-01-19 DIAGNOSIS — F1010 Alcohol abuse, uncomplicated: Secondary | ICD-10-CM

## 2015-01-19 DIAGNOSIS — Z7289 Other problems related to lifestyle: Secondary | ICD-10-CM

## 2015-01-19 DIAGNOSIS — F0390 Unspecified dementia without behavioral disturbance: Secondary | ICD-10-CM

## 2015-01-19 DIAGNOSIS — F1090 Alcohol use, unspecified, uncomplicated: Secondary | ICD-10-CM

## 2015-01-19 DIAGNOSIS — I639 Cerebral infarction, unspecified: Secondary | ICD-10-CM
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
## 2015-01-19 NOTE — Progress Notes (Signed)
Pre visit review using our clinic review tool, if applicable. No additional management support is needed unless otherwise documented below in the visit note. 

## 2015-01-20 ENCOUNTER — Encounter: Payer: Self-pay | Admitting: Family Medicine

## 2015-01-20 NOTE — Assessment & Plan Note (Signed)
See above,

## 2015-01-25 ENCOUNTER — Telehealth: Payer: Self-pay | Admitting: Family Medicine

## 2015-01-25 NOTE — Telephone Encounter (Signed)
Noted. Thanks.

## 2015-01-25 NOTE — Telephone Encounter (Signed)
Nurse from [facility] called, stating that they were not able to see pt over weekend, they could not get in contact with pt or his daughter.  They plan on seeing pt this week, wanted to notify us of the delay.

## 2015-01-29 ENCOUNTER — Telehealth: Payer: Self-pay

## 2015-01-29 NOTE — Telephone Encounter (Signed)
Called and spoken to [place]. Gave the verbal order.

## 2015-01-29 NOTE — Telephone Encounter (Signed)
Sarah nurse with [facility] left v/m requesting verbal order for home health nursing to monitor medication and education to pt.

## 2015-01-29 NOTE — Telephone Encounter (Signed)
plz give verbal ok for this. 

## 2015-02-04 DIAGNOSIS — F1027 Alcohol dependence with alcohol-induced persisting dementia: Secondary | ICD-10-CM

## 2015-02-04 DIAGNOSIS — I70203 Unspecified atherosclerosis of native arteries of extremities, bilateral legs: Secondary | ICD-10-CM | POA: Diagnosis not present

## 2015-02-04 DIAGNOSIS — F0280 Dementia in other diseases classified elsewhere without behavioral disturbance: Secondary | ICD-10-CM

## 2015-02-04 DIAGNOSIS — J449 Chronic obstructive pulmonary disease, unspecified: Secondary | ICD-10-CM

## 2015-03-16 ENCOUNTER — Other Ambulatory Visit: Payer: Self-pay | Admitting: Family Medicine

## 2015-03-17 ENCOUNTER — Other Ambulatory Visit: Payer: Self-pay | Admitting: *Deleted

## 2015-03-17 MED ORDER — DONEPEZIL HCL 5 MG PO TABS: ORAL_TABLET | ORAL | Status: DC

## 2015-03-17 MED ORDER — PRAVASTATIN SODIUM 40 MG PO TABS: ORAL_TABLET | ORAL | Status: AC

## 2015-03-17 MED ORDER — MEMANTINE HCL 5 MG PO TABS: 5.0000 mg | ORAL_TABLET | Freq: Two times a day (BID) | ORAL | Status: DC

## 2015-03-17 MED ORDER — LISINOPRIL 5 MG PO TABS: ORAL_TABLET | ORAL | Status: DC

## 2015-03-27 ENCOUNTER — Other Ambulatory Visit: Payer: Self-pay | Admitting: Family Medicine

## 2015-03-30 ENCOUNTER — Other Ambulatory Visit: Payer: Self-pay | Admitting: Family Medicine

## 2015-03-31 ENCOUNTER — Other Ambulatory Visit: Payer: Self-pay | Admitting: Family Medicine

## 2015-05-11 ENCOUNTER — Ambulatory Visit: Payer: Medicare Other | Admitting: Family Medicine

## 2015-05-12 ENCOUNTER — Telehealth: Payer: Self-pay | Admitting: Family Medicine

## 2015-05-12 NOTE — Telephone Encounter (Signed)
Pt did not come in for their appt yesterday 05/11/15 for follow up. Please let me know if pt needs to be contacted immediately for follow up or no follow up needed. Best phone number to contact pt is [phone].

## 2015-05-17 NOTE — Telephone Encounter (Signed)
Called and left message on daughter's answering machine Shaun Contreras)

## 2015-05-18 NOTE — Telephone Encounter (Signed)
Vernona Rieger pts daughter left v/m returning Dr Timoteo Expose call; pt has moved in with pts son and pt is no longer in this area. Vernona Rieger request cb.

## 2015-06-06 ENCOUNTER — Other Ambulatory Visit: Payer: Self-pay | Admitting: Family Medicine

## 2015-07-21 ENCOUNTER — Other Ambulatory Visit: Payer: Self-pay | Admitting: Family Medicine

## 2015-07-23 ENCOUNTER — Other Ambulatory Visit: Payer: Self-pay | Admitting: Family Medicine

## 2015-07-24 ENCOUNTER — Other Ambulatory Visit: Payer: Self-pay | Admitting: Family Medicine

## 2015-07-30 NOTE — Telephone Encounter (Signed)
Spoke with daughter. Pt moved in with his son. Limited communication between daughter and son. She will bring him home a few days over christmas season.

## 2015-08-20 ENCOUNTER — Ambulatory Visit: Payer: Medicare Other | Admitting: Family Medicine

## 2016-01-31 ENCOUNTER — Telehealth: Payer: Self-pay | Admitting: Cardiovascular Disease

## 2016-01-31 NOTE — Telephone Encounter (Signed)
Lmom to call our office to schedule an arterial lower u/s.

## 2016-02-19 ENCOUNTER — Other Ambulatory Visit: Payer: Self-pay | Admitting: Family Medicine

## 2016-02-21 NOTE — Telephone Encounter (Addendum)
Received refill request electronically Last office visit 01/19/15 Cancelled last appointment 08/20/15 and no showed for another appointment

## 2016-02-22 NOTE — Telephone Encounter (Signed)
Refill short term. I believe pt is establishing with new MD.

## 2016-02-25 ENCOUNTER — Other Ambulatory Visit: Payer: Self-pay | Admitting: Family Medicine

## 2016-03-21 ENCOUNTER — Other Ambulatory Visit: Payer: Self-pay | Admitting: Family Medicine

## 2016-03-21 NOTE — Telephone Encounter (Signed)
Last OV 01-06-15. Cancelled or No Show twice in the last year. What would you like to do?

## 2016-03-23 NOTE — Telephone Encounter (Signed)
I believe patient has moved.  Will need to establish with new MD. I've refilled refills x2 months.  If desires to continue seeing me, he needs to return to see me shortly.

## 2016-03-26 ENCOUNTER — Other Ambulatory Visit: Payer: Self-pay | Admitting: Family Medicine

## 2016-05-30 ENCOUNTER — Other Ambulatory Visit: Payer: Self-pay | Admitting: Family Medicine

## 2016-07-05 ENCOUNTER — Telehealth: Payer: Self-pay | Admitting: Family Medicine

## 2016-07-05 NOTE — Telephone Encounter (Signed)
LVM for pt daughter to call back and schedule AWV + labs with Virl AxeLesia and OV 30 with PCP.

## 2020-06-14 DEATH — deceased
